# Patient Record
Sex: Male | Born: 1975 | Race: White | Hispanic: No | Marital: Married | Smoking: Light tobacco smoker
Health system: Southern US, Community
[De-identification: ages and names within clinical notes are randomized; demographics above are authoritative.]

## PROBLEM LIST (undated history)

## (undated) DIAGNOSIS — I10 Essential (primary) hypertension: Secondary | ICD-10-CM

## (undated) DIAGNOSIS — Z87442 Personal history of urinary calculi: Secondary | ICD-10-CM

## (undated) DIAGNOSIS — G4730 Sleep apnea, unspecified: Secondary | ICD-10-CM

## (undated) DIAGNOSIS — K759 Inflammatory liver disease, unspecified: Secondary | ICD-10-CM

## (undated) DIAGNOSIS — F32A Depression, unspecified: Secondary | ICD-10-CM

## (undated) HISTORY — PX: HERNIA REPAIR: SHX51

## (undated) HISTORY — PX: CARDIAC CATHETERIZATION: SHX172

---

## 2014-01-15 ENCOUNTER — Ambulatory Visit (HOSPITAL_COMMUNITY): Payer: Medicaid Other | Admitting: Physical Therapy

## 2014-01-17 ENCOUNTER — Ambulatory Visit (HOSPITAL_COMMUNITY)
Admission: RE | Admit: 2014-01-17 | Discharge: 2014-01-17 | Disposition: A | Discharge status: Home / Self Care | Payer: Medicaid Other | Source: Ambulatory Visit | Attending: Family Medicine | Admitting: Family Medicine

## 2014-01-17 DIAGNOSIS — M25651 Stiffness of right hip, not elsewhere classified: Secondary | ICD-10-CM | POA: Diagnosis not present

## 2014-01-17 DIAGNOSIS — M25551 Pain in right hip: Secondary | ICD-10-CM | POA: Insufficient documentation

## 2014-01-17 DIAGNOSIS — M1611 Unilateral primary osteoarthritis, right hip: Secondary | ICD-10-CM

## 2014-01-17 DIAGNOSIS — Z5189 Encounter for other specified aftercare: Secondary | ICD-10-CM | POA: Diagnosis present

## 2014-01-17 DIAGNOSIS — R29898 Other symptoms and signs involving the musculoskeletal system: Secondary | ICD-10-CM

## 2014-01-17 DIAGNOSIS — R262 Difficulty in walking, not elsewhere classified: Secondary | ICD-10-CM | POA: Insufficient documentation

## 2014-01-17 DIAGNOSIS — M25552 Pain in left hip: Secondary | ICD-10-CM | POA: Insufficient documentation

## 2014-01-17 DIAGNOSIS — M25652 Stiffness of left hip, not elsewhere classified: Secondary | ICD-10-CM | POA: Insufficient documentation

## 2014-01-17 DIAGNOSIS — M25659 Stiffness of unspecified hip, not elsewhere classified: Secondary | ICD-10-CM

## 2014-01-17 NOTE — Therapy (Signed)
[facility] [address] [place], [place], [address] Phone: [phone]   Fax:  [phone]  Physical Therapy Evaluation  Patient Details  Name: Jeremy RossettiDanny Dudley MRN: [id number] Date of Birth: 12/09/1975  Encounter Date: 01/17/2014      Dudley End of Session - 01/17/14 1508    Visit Number 1   Number of Visits 1   Authorization Type Medicaid [place]   Authorization - Visit Number 1   Authorization - Number of Visits 1   Dudley Start Time 0800   Dudley Stop Time 0845   Dudley Time Calculation (min) 45 min   Equipment Utilized During Treatment Gait belt   Activity Tolerance Patient tolerated treatment well   Behavior [facility] Therapy [facility] for tasks assessed/performed      No past medical history on file.  No past surgical history on file.  There were no vitals taken for this visit.  Visit Diagnosis:  Primary osteoarthritis of right hip  Weakness of both hips  Hip stiffness, unspecified laterality      Subjective Assessment - 01/17/14 0809    Pertinent History long history of hip pain over the last 20 years durign taikwondoe. Bilateral hip pain, Rt pain > Lt , no nyumbness and tinlging. history of back pain too, in sciatic region on Rt, no recent back pain. patient can be walking normal no pain and all of a sudden it comes on.    Diagnostic tests no recent xrays, previous xrays (7 years ago ) found osteo arthritis   Currently in Pain? Yes   Pain Score 0-No pain   Pain Location Hip   Pain Orientation Right;Left;Anterior   Pain Descriptors / Indicators Stabbing;Lambert ModySharp          [facility] Dudley Assessment - 01/17/14 0001    Assessment   Medical Diagnosis Bilateral hip pain.    Onset Date 01/17/94   Next MD Visit [place]   Precautions   Precautions None   Restrictions   Weight Bearing Restrictions No   Balance Screen   Has the patient fallen in the past 6 months No   Prior Function   Level of Independence Independent with basic ADLs   Sit to Stand    Comments independent no UE assist.    Other:   Other/ Comments Gait: excessive toe out, xcessive pronation, limited ability to toe in (none past neutral, excessicve trndelenberg gait   AROM   Overall AROM Comments Lt WNL unless noted   Right Hip Extension -10   Right Hip Flexion 55  painful 65 on Lt    Right Hip External Rotation  55   Right Hip Internal Rotation  -10   Right Hip ABduction 20   Left Hip Extension -10   Left Hip Flexion 65   Left Hip External Rotation  55   Left Hip Internal Rotation  -15   Left Hip ABduction 20   Right Knee Extension 0   Right Knee Flexion 130   Right Ankle Dorsiflexion 10  8on Lt    Strength   Right Hip Flexion 2+/5   Right Hip Extension 2-/5   Right Hip ABduction 3+/5   Right Knee Flexion 4/5   Right Knee Extension 5/5   Right Ankle Dorsiflexion 5/5            Dudley Education - 01/17/14 1507    Education provided Yes   Education Details HEP given, see patient instructions   Person(s) Educated Patient;Spouse   Methods Explanation;Demonstration;Handout;Tactile  cues;Verbal cues   Comprehension Verbalized understanding;Returned demonstration           Plan - 01/17/14 1508    Clinical Impression Statement Patient presents with severe hip pain secondary to severe hip stiffness resulting in difficulty walking and abnormal gait. patient would benefit from skilled phsyical therapy but is unable to financially afford continued care. Session focused of education of patient for performance of HEP and need to return to MD if pain does not improve as patient displays signs and symptoms consistent with severe osteoarthritis and possible proximal femoral fracture as indicated by positive gainslands test and  positive scours test.    Dudley Treatment/Interventions Therapeutic exercise   Dudley Next Visit Plan patient discharged with HEP and instruction to return to MD if pain continues.    Dudley Home Exercise Plan given   Consulted and Agree with Plan of  Care Patient        Jerilee FieldCash Joselin Crandell Dudley DPT [phone]     ____________________________________________________   ______________ Physician signature          Date

## 2014-01-17 NOTE — Patient Instructions (Signed)
Knee Roll   Lying on back, with knees bent and feet flat on floor, arms outstretched to sides, slowly roll both knees to side, hold 5 seconds. Back to starting position, hold 5 seconds. Then to opposite side, hold 5 seconds. Return to starting position. Keep shoulders and arms in contact with floor.   Copyright  VHI. All rights reserved.   Bridge   Lie back, legs bent. Inhale, pressing hips up. Keeping ribs in, lengthen lower back. Exhale, rolling down along spine from top. Repeat 10 times, hold 3 seconds. Do 2 sessions per day. Abduction   Lift leg up toward ceiling. Return.  Repeat 10 times build up to 20x each leg. Do 2 sessions per day.  http://[email or web address].exer.us/385   Copyright  VHI. All rights reserved.

## 2015-04-03 ENCOUNTER — Encounter (HOSPITAL_COMMUNITY): Payer: Self-pay

## 2018-10-01 ENCOUNTER — Ambulatory Visit: Payer: Self-pay | Admitting: Orthopaedic Surgery

## 2018-10-17 ENCOUNTER — Ambulatory Visit (INDEPENDENT_AMBULATORY_CARE_PROVIDER_SITE_OTHER): Payer: Medicare Other | Admitting: Orthopaedic Surgery

## 2018-10-17 ENCOUNTER — Ambulatory Visit (INDEPENDENT_AMBULATORY_CARE_PROVIDER_SITE_OTHER): Payer: Medicare Other

## 2018-10-17 VITALS — Ht 64.0 in | Wt 324.0 lb

## 2018-10-17 DIAGNOSIS — M25551 Pain in right hip: Secondary | ICD-10-CM | POA: Diagnosis not present

## 2018-10-17 DIAGNOSIS — M1612 Unilateral primary osteoarthritis, left hip: Secondary | ICD-10-CM

## 2018-10-17 DIAGNOSIS — M1611 Unilateral primary osteoarthritis, right hip: Secondary | ICD-10-CM | POA: Diagnosis not present

## 2018-10-17 MED ORDER — HYDROCODONE-ACETAMINOPHEN 5-325 MG PO TABS: 1.0000 | ORAL_TABLET | Freq: Four times a day (QID) | ORAL | 0 refills | Status: DC | PRN

## 2018-10-17 NOTE — Progress Notes (Signed)
Office Visit Note   Patient: Jeremy RossettiDanny Dudley           Date of Birth: 08/06/1975           MRN: [id number] Visit Date: 10/17/2018              Requested by: No referring provider defined for this encounter. PCP: Alvina FilbertHunter, Denise, MD   Assessment & Plan: Visit Diagnoses:  1. Pain in right hip   2. Unilateral primary osteoarthritis, left hip   3. Unilateral primary osteoarthritis, right hip     Plan: Based on his x-rays and clinical exam I do feel that the hip replacement for his right hip is needed.  I had a long and thorough discussion with him and he understands the difficulty of the surgery due to his morbid obesity and the complication risk being heightened for implant failure and dislocation as well as soft tissue issues.  I do feel he is heading in the right direction for weight standpoint and I believe that he will be a candidate for the surgery very soon.  I would like to see him back in 2 months.  This will be for repeat weight.  X-rays are not needed.  At that visit I can talk with him more about the surgery and showed him some implants and then make a decision of proceeding with a surgery date.  All question concerns were answered and addressed.  I will provide a short course of hydrocodone for him.  Follow-Up Instructions: Return in about 2 months (around 12/17/2018).   Orders:  Orders Placed This Encounter  Procedures  . XR HIP UNILAT W OR W/O PELVIS 1V RIGHT   Meds ordered this encounter  Medications  . HYDROcodone-acetaminophen (NORCO/VICODIN) 5-325 MG tablet    Sig: Take 1 tablet by mouth every 6 (six) hours as needed for moderate pain.    Dispense:  30 tablet    Refill:  0      Procedures: No procedures performed   Clinical Data: No additional findings.   Subjective: Chief Complaint  Patient presents with  . Right Hip - Pain  The patient is a very pleasant 43 year old gentleman with debilitating right hip pain for over 10 years now.  He is someone who is  morbidly obese with a BMI of over 50.  It is actually 55.6 today in the office.  He is 5 foot 4 and 324 pounds.  At his highest weight though he was 380 pounds.  He has been to other orthopedic practices before I have recommended weight loss appropriately in order to be able to successfully perform a total hip arthroplasty.  Both hips hurt quite significantly with the right one is much more severe than left hip.  He walks with canes.  He is worked on activity modification and weight loss.  He has lost even 10 pounds in the last 2 weeks alone.  He is highly motivated for continued to lose weight so he can proceed with hip replacement surgery.  His pain is 10 out of 10 and is daily.  It is detrimentally affect his mobility, his quality of life and his activities daily living.  At this point is a significant fall risk.  He is not a diabetic.  He denies any other significant medical issues.  I was actually able to review his chart and care everywhere to see what his weight was like in the past and what recommendations have been in the past.  He has  been referred to bariatric clinic before.  He is adverse to weight loss surgery given the fact that he has had family members that did not have success with the older surgery with significant complications and that scares him.  HPI  Review of Systems He currently denies any headache, chest pain, shortness of breath, fever, chills, nausea, vomiting  Objective: Vital Signs: Ht 5\' 4"  (1.626 m)   Wt (!) 324 lb (147 kg)   BMI 55.61 kg/m   Physical Exam He is alert and oriented x3 and in no acute distress.  He walks very slow and it is a struggle to get him up on any type of table to examine his hip due to his obesity. Ortho Exam I was able to lay him in a supine position.  His abdomen is easily able to mobilize to get out of the way for soft tissue planes to get appropriately to his right hip for a successful hip replacement in terms of a technically challenging  hip.  However his obesity does make this significant more challenging.  I can essentially not even rotate his right hip due to the severity of his pain and the likely deformity of that hip.  His left hip is painful but does move a little bit more smoothly. Specialty Comments:  No specialty comments available.  Imaging: Xr Hip Unilat W Or W/o Pelvis 1v Right  Result Date: 10/17/2018 An AP pelvis and lateral right hip shows complete loss of the hip space on the right side.  The hip femoral head on the right side is flattened significantly and has cystic changes as well.  There is likely a component of collapse.  The left hip has severe arthritic changes.    PMFS History: Patient Active Problem List   Diagnosis Date Noted  . Unilateral primary osteoarthritis, left hip 10/17/2018  . Unilateral primary osteoarthritis, right hip 10/17/2018   No past medical history on file.  No family history on file.   Social History   Occupational History  . Not on file  Tobacco Use  . Smoking status: Not on file  Substance and Sexual Activity  . Alcohol use: Not on file  . Drug use: Not on file  . Sexual activity: Not on file

## 2018-12-12 ENCOUNTER — Ambulatory Visit: Payer: Medicare Other | Admitting: Urology

## 2018-12-17 ENCOUNTER — Ambulatory Visit: Payer: Medicare Other | Admitting: Orthopaedic Surgery

## 2018-12-24 ENCOUNTER — Other Ambulatory Visit: Payer: Self-pay

## 2018-12-24 ENCOUNTER — Encounter: Payer: Self-pay | Admitting: Orthopaedic Surgery

## 2018-12-24 ENCOUNTER — Ambulatory Visit (INDEPENDENT_AMBULATORY_CARE_PROVIDER_SITE_OTHER): Payer: Medicare Other | Admitting: Orthopaedic Surgery

## 2018-12-24 VITALS — Wt 318.0 lb

## 2018-12-24 DIAGNOSIS — M1611 Unilateral primary osteoarthritis, right hip: Secondary | ICD-10-CM | POA: Diagnosis not present

## 2018-12-24 DIAGNOSIS — M1612 Unilateral primary osteoarthritis, left hip: Secondary | ICD-10-CM

## 2018-12-24 NOTE — Progress Notes (Signed)
The patient comes in today to be weighed again for consideration of total hip replacement surgery.  He does have severe end-stage arthritis of both sets of the right worse worse than left.  He walks with bilateral canes.  His pain is daily and is severe.  His x-rays of the last visit show severe end-stage arthritis of both hips with the right showing almost femoral head collapse.  At his last visit he weighed 324 pounds.  He is very short see as much higher BMI..  On exam today I had him lay supine.  I feel very comfortable getting the plane that we need to get to his right hip based on his anatomy.  His hip pain is severe with any attempts of rotation.  His weight today is 318 pounds which is down 6 pounds from his previous time being here.  However his BMI is 54.58.  He is very short and that contributes to her BMI.  Again, I am comfortable with proceeding with surgery once he has.  He would like to lose more weight and I think that will certainly help as well.  All question concerns were answered addressed.  We will see him back in 2 months for repeat weight.

## 2018-12-26 ENCOUNTER — Other Ambulatory Visit: Payer: Self-pay | Admitting: Orthopaedic Surgery

## 2018-12-26 ENCOUNTER — Telehealth: Payer: Self-pay | Admitting: Orthopaedic Surgery

## 2018-12-26 MED ORDER — HYDROCODONE-ACETAMINOPHEN 5-325 MG PO TABS: 1.0000 | ORAL_TABLET | Freq: Four times a day (QID) | ORAL | 0 refills | Status: DC | PRN

## 2018-12-26 NOTE — Telephone Encounter (Signed)
Pt called in requesting a refill on Hydrocodone, please have that sent to [facility].   [phone]

## 2018-12-26 NOTE — Telephone Encounter (Signed)
Please advise 

## 2019-01-09 ENCOUNTER — Ambulatory Visit (INDEPENDENT_AMBULATORY_CARE_PROVIDER_SITE_OTHER): Payer: Medicare Other | Admitting: Urology

## 2019-01-09 DIAGNOSIS — R3981 Functional urinary incontinence: Secondary | ICD-10-CM

## 2019-02-07 ENCOUNTER — Other Ambulatory Visit: Payer: Self-pay

## 2019-02-07 ENCOUNTER — Telehealth: Payer: Self-pay | Admitting: Urology

## 2019-02-07 DIAGNOSIS — N3941 Urge incontinence: Secondary | ICD-10-CM

## 2019-02-07 MED ORDER — MIRABEGRON ER 25 MG PO TB24: 25.0000 mg | ORAL_TABLET | Freq: Every day | ORAL | 0 refills | Status: DC

## 2019-02-07 NOTE — Telephone Encounter (Signed)
Pts daughter called and states that the samples pt got at last visit was working but they misplaced the rest and need a presciption. It was myrbetriq.  [facility] [place]

## 2019-02-07 NOTE — Telephone Encounter (Signed)
One time prescription refill sent in.

## 2019-02-07 NOTE — Progress Notes (Signed)
myrbetriq 25mg  prescription sent in as requested. No refills. Pt has appointment to see MD 1/20.

## 2019-02-20 ENCOUNTER — Ambulatory Visit (INDEPENDENT_AMBULATORY_CARE_PROVIDER_SITE_OTHER): Payer: Medicare Other | Admitting: Urology

## 2019-02-20 ENCOUNTER — Encounter: Payer: Self-pay | Admitting: Urology

## 2019-02-20 ENCOUNTER — Other Ambulatory Visit: Payer: Self-pay

## 2019-02-20 VITALS — BP 121/73 | HR 67 | Temp 95.8°F | Ht 64.0 in | Wt 310.0 lb

## 2019-02-20 DIAGNOSIS — N3941 Urge incontinence: Secondary | ICD-10-CM

## 2019-02-20 LAB — BLADDER SCAN AMB NON-IMAGING: Scan Result: 16.7

## 2019-02-20 MED ORDER — MIRABEGRON ER 50 MG PO TB24: 50.0000 mg | ORAL_TABLET | Freq: Every day | ORAL | 11 refills | Status: DC

## 2019-02-20 NOTE — Progress Notes (Signed)
Urological Symptom Review  Patient is experiencing the following symptoms:  Frequency Urge  Review of Systems  Gastrointestinal (upper)  : Negative for upper GI symptoms  Gastrointestinal (lower) : Negative for lower GI symptoms  Constitutional : Negative for symptoms  Skin: Negative for skin symptoms  Eyes: Negative for eye symptoms  Ear/Nose/Throat : Negative for Ear/Nose/Throat symptoms  Hematologic/Lymphatic: Negative for Hematologic/Lymphatic symptoms  Cardiovascular : Negative for cardiovascular symptoms  Respiratory : Negative for respiratory symptoms  Endocrine: Negative for endocrine symptoms  Musculoskeletal: Joint pain  Neurological: Negative for neurological symptoms  Psychologic: Negative for psychiatric symptoms negative

## 2019-02-20 NOTE — Progress Notes (Signed)
02/20/2019 11:16 AM   Jeremy Dudley [birth year] [id number]  Referring provider: Abran Richard, MD [address] [place],  [place] [address]  Urge incontinence  HPI: Jeremy Dudley is a 44yo here for followup for urgency and urge incontinence. No issues with constipation. Last visit he was started on mirabegron 25mg  and noted improvement in nocturia to 1x. Urge incontinence has resolved. Urgnecy and frequency improved. NO dysuria   PMH: No past medical history on file.  Surgical History: No past surgical history on file.  Home Medications:  Allergies as of 02/20/2019   No Known Allergies     Medication List       Accurate as of February 20, 2019 11:16 AM. If you have any questions, ask your nurse or doctor.        aspirin 81 MG chewable tablet Chew by mouth daily.   aspirin 81 MG EC tablet Take by mouth.   buPROPion 300 MG 24 hr tablet Commonly known as: WELLBUTRIN XL Take 300 mg by mouth daily.   citalopram 40 MG tablet Commonly known as: CELEXA Take 40 mg by mouth daily.   geriatric multivitamins-minerals Elix Take 15 mLs by mouth daily.   hydrochlorothiazide 12.5 MG capsule Commonly known as: MICROZIDE Take 12.5 mg by mouth daily.   HYDROcodone-acetaminophen 5-325 MG tablet Commonly known as: NORCO/VICODIN Take 1 tablet by mouth every 6 (six) hours as needed for moderate pain.   mirabegron ER 25 MG Tb24 tablet Commonly known as: MYRBETRIQ Take 1 tablet (25 mg total) by mouth daily.       Allergies: No Known Allergies  Family History: No family history on file.  Social History:  has no history on file for tobacco, alcohol, and drug.  ROS: All other review of systems was reviewed and are negative except what is noted above in HPI  Physical Exam: BP 121/73   Pulse 67   Temp (!) 95.8 F (35.4 C)   Ht 5\' 4"  (1.626 m)   Wt (!) 310 lb (140.6 kg)   BMI 53.21 kg/m   Constitutional:  Alert and oriented, No acute distress. HEENT: [place] AT, moist  mucus membranes.  Trachea midline, no masses. Cardiovascular: No clubbing, cyanosis, or edema. Respiratory: Normal respiratory effort, no increased work of breathing. GI: Abdomen is soft, nontender, nondistended, no abdominal masses GU: No CVA tenderness Lymph: No cervical or inguinal lymphadenopathy. Skin: No rashes, bruises or suspicious lesions. Neurologic: Grossly intact, no focal deficits, moving all 4 extremities. Psychiatric: Normal mood and affect.  Laboratory Data: No results found for: WBC, HGB, HCT, MCV, PLT  No results found for: CREATININE  No results found for: PSA  No results found for: TESTOSTERONE  No results found for: HGBA1C  Urinalysis No results found for: COLORURINE, APPEARANCEUR, LABSPEC, PHURINE, GLUCOSEU, HGBUR, BILIRUBINUR, KETONESUR, PROTEINUR, UROBILINOGEN, NITRITE, LEUKOCYTESUR  No results found for: LABMICR, [place], RBCUA, LABEPIT, MUCUS, BACTERIA  Pertinent Imaging:  No results found for this or any previous visit. No results found for this or any previous visit. No results found for this or any previous visit. No results found for this or any previous visit. No results found for this or any previous visit. No results found for this or any previous visit. No results found for this or any previous visit. No results found for this or any previous visit.  Assessment & Plan:    1. Urge incontinence -Increase mirabegron dose to 50mg   -RTC 6 months  - Bladder Scan (Post Void Residual) in  office   No follow-ups on file.  Jeremy Aye, MD  [facility] [place]

## 2019-02-20 NOTE — Patient Instructions (Signed)

## 2019-02-25 ENCOUNTER — Ambulatory Visit: Payer: Medicare Other | Admitting: Orthopaedic Surgery

## 2019-03-04 ENCOUNTER — Encounter: Payer: Self-pay | Admitting: Orthopaedic Surgery

## 2019-03-04 ENCOUNTER — Ambulatory Visit (INDEPENDENT_AMBULATORY_CARE_PROVIDER_SITE_OTHER): Payer: Medicare Other | Admitting: Orthopaedic Surgery

## 2019-03-04 ENCOUNTER — Other Ambulatory Visit: Payer: Self-pay

## 2019-03-04 ENCOUNTER — Other Ambulatory Visit: Payer: Self-pay | Admitting: Orthopaedic Surgery

## 2019-03-04 VITALS — Wt 317.0 lb

## 2019-03-04 DIAGNOSIS — M1612 Unilateral primary osteoarthritis, left hip: Secondary | ICD-10-CM | POA: Diagnosis not present

## 2019-03-04 DIAGNOSIS — M1611 Unilateral primary osteoarthritis, right hip: Secondary | ICD-10-CM

## 2019-03-04 MED ORDER — HYDROCODONE-ACETAMINOPHEN 5-325 MG PO TABS: 1.0000 | ORAL_TABLET | Freq: Four times a day (QID) | ORAL | 0 refills | Status: DC | PRN

## 2019-03-04 NOTE — Progress Notes (Signed)
The patient is well-known to me.  He is a morbidly obese 44 year old gentleman with debilitating arthritis of both his hips.  He has now down to 317 pounds.  He is still at a BMI of 54 but that is due to the fact that he is only 5 foot 4 inches tall.  On exam he has debilitating pain with any attempts of range of motion of either hip.  His hip x-rays show end-stage arthritis of both hips.  When I lay him in supine position I am becoming more comfortable with being able to get the soft tissue planes to perform hip replacement surgery on him.  We still need to him to lose a little more weight and he would like to as well.  Without being said we will see him back in 3 months.  We have encouraged still working on his diet and weight loss activities.  In 3 months from now we will see him back for repeat weight and BMI calculation..  All question concerns were answered and addressed.

## 2019-06-03 ENCOUNTER — Ambulatory Visit: Payer: Medicare Other | Admitting: Orthopaedic Surgery

## 2019-06-05 ENCOUNTER — Other Ambulatory Visit: Payer: Self-pay

## 2019-06-05 ENCOUNTER — Ambulatory Visit (INDEPENDENT_AMBULATORY_CARE_PROVIDER_SITE_OTHER): Payer: Medicare Other | Admitting: Orthopaedic Surgery

## 2019-06-05 VITALS — Ht 64.0 in | Wt 320.0 lb

## 2019-06-05 DIAGNOSIS — M1611 Unilateral primary osteoarthritis, right hip: Secondary | ICD-10-CM

## 2019-06-05 DIAGNOSIS — M1612 Unilateral primary osteoarthritis, left hip: Secondary | ICD-10-CM | POA: Diagnosis not present

## 2019-06-05 NOTE — Progress Notes (Signed)
The patient is well-known to me.  I have seen him for many months now.  He is someone with debilitating arthritis involving both of his hips.  He walks with walking sticks.  He is a short individual who is under 5 feet tall.  He does weigh 320 pounds with this puts his BMI at almost 55.  However he has lost 70 to 80 pounds as we have worked on getting his weight down.  Given his short stature makes his BMI certainly higher.  On exam today, I am able to easily lay him down in the supine position.  His ankles and feet are small so we can put the special boots on his feet for anterior hip surgery on the [place] table.  I am easily able to mobilize his abdomen to get to a soft tissue plane where I am comfortable performing hip replacement surgery on him.  His x-rays show complete loss of his joint space with both hips with the right much worse than the left.  He has tried and failed all forms conservative treatment.  I think he is at his maximum plateau of weight loss and hip replacement surgery at this point will give him the quality of life and ability he needs to continue his weight loss journey.  His pain is daily and it is 10 out of 10.  At this point it is so detrimentally affecting his mobility and his quality of life as well as activities day living he does wish to proceed with hip replacement surgery if I am comfortable with this as well.  I did let him know I am certainly comfortable but there are always heightened risks of acute blood loss anemia, nerve vessel injury, fracture, infection, DVT and implant failure as well as dislocation given his obesity.  He understands her goals are to decrease pain and improve mobility and overall poor quality of life.  We will work on getting him on the OR schedule for a right hip replacement in the near future.  All questions and concerns were answered and addressed.

## 2019-07-03 ENCOUNTER — Other Ambulatory Visit: Payer: Self-pay

## 2019-07-17 ENCOUNTER — Other Ambulatory Visit: Payer: Self-pay | Admitting: Physician Assistant

## 2019-07-18 NOTE — Progress Notes (Signed)
[facility] - [place], [place] - [address] [place] [address] Phone: [phone] Fax: [phone]      Your procedure is scheduled on July 23, 2019.  Report to [facility] Main Entrance "A" at 10:00 A.M., and check in at the Admitting office.  Call this number if you have problems the morning of surgery:  [phone]  Call [phone] if you have any questions prior to your surgery date Monday-Friday 8am-4pm    Remember:  Do not eat after midnight the night before your surgery  You may drink clear liquids until 9:00 the morning of your surgery.   Clear liquids allowed are: Water, Non-Citrus Juices (without pulp), Carbonated Beverages, Clear Tea, Black Coffee Only, and Gatorade  Please complete your PRE-SURGERY ENSURE that was provided to you by 9:00 the morning of surgery.  Please, if able, drink it in one setting. DO NOT SIP.    Take these medicines the morning of surgery with A SIP OF WATER: buPROPion (WELLBUTRIN XL) citalopram (CELEXA) metoprolol tartrate (LOPRESSOR) mirabegron ER (MYRBETRIQ)  As of today, STOP taking any Aspirin (unless otherwise instructed by your surgeon) and Aspirin containing products, Aleve, Naproxen, Ibuprofen, Motrin, Advil, Goody's, BC's, all herbal medications, fish oil, and all vitamins.                      Do not wear jewelry            Do not wear lotions, powders, colognes, or deodorant.            Do not shave 48 hours prior to surgery.              Do not bring valuables to the hospital.            [facility] is not responsible for any belongings or valuables.  Do NOT Smoke (Tobacco/Vapping) or drink Alcohol 24 hours prior to your procedure If you use a CPAP at night, you may bring all equipment for your overnight stay.   Contacts, glasses, dentures or bridgework may not be worn into surgery.      For patients admitted to the hospital, discharge time will be determined by your treatment  team.   Patients discharged the day of surgery will not be allowed to drive home, and someone needs to stay with them for 24 hours.    Special instructions:   [facility]- Preparing For Surgery  Before surgery, you can play an important role. Because skin is not sterile, your skin needs to be as free of germs as possible. You can reduce the number of germs on your skin by washing with CHG (chlorahexidine gluconate) Soap before surgery.  CHG is an antiseptic cleaner which kills germs and bonds with the skin to continue killing germs even after washing.    Oral Hygiene is also important to reduce your risk of infection.  Remember - BRUSH YOUR TEETH THE MORNING OF SURGERY WITH YOUR REGULAR TOOTHPASTE  Please do not use if you have an allergy to CHG or antibacterial soaps. If your skin becomes reddened/irritated stop using the CHG.  Do not shave (including legs and underarms) for at least 48 hours prior to first CHG shower. It is OK to shave your face.  Please follow these instructions carefully.   1. Shower the NIGHT BEFORE SURGERY and the MORNING OF SURGERY with CHG Soap.   2. If you chose to wash your hair, wash your hair first  as usual with your normal shampoo.  3. After you shampoo, rinse your hair and body thoroughly to remove the shampoo.  4. Use CHG as you would any other liquid soap. You can apply CHG directly to the skin and wash gently with a scrungie or a clean washcloth.   5. Apply the CHG Soap to your body ONLY FROM THE NECK DOWN.  Do not use on open wounds or open sores. Avoid contact with your eyes, ears, mouth and genitals (private parts). Wash Face and genitals (private parts)  with your normal soap.   6. Wash thoroughly, paying special attention to the area where your surgery will be performed.  7. Thoroughly rinse your body with warm water from the neck down.  8. DO NOT shower/wash with your normal soap after using and rinsing off the CHG Soap.  9. Pat yourself dry  with a CLEAN TOWEL.  10. Wear CLEAN PAJAMAS to bed the night before surgery, wear comfortable clothes the morning of surgery  11. Place CLEAN SHEETS on your bed the night of your first shower and DO NOT SLEEP WITH PETS.   Day of Surgery:   Do not apply any deodorants/lotions.  Please wear clean clothes to the hospital/surgery center.   Remember to brush your teeth WITH YOUR REGULAR TOOTHPASTE.   Please read over the following fact sheets that you were given.

## 2019-07-19 ENCOUNTER — Encounter (HOSPITAL_COMMUNITY)
Admission: RE | Admit: 2019-07-19 | Discharge: 2019-07-19 | Disposition: A | Discharge status: Home / Self Care | Payer: Medicare Other | Source: Ambulatory Visit | Attending: Orthopaedic Surgery | Admitting: Orthopaedic Surgery

## 2019-07-19 ENCOUNTER — Encounter (HOSPITAL_COMMUNITY): Payer: Self-pay

## 2019-07-19 ENCOUNTER — Other Ambulatory Visit (HOSPITAL_COMMUNITY)
Admission: RE | Admit: 2019-07-19 | Discharge: 2019-07-19 | Disposition: A | Discharge status: Home / Self Care | Payer: Medicare Other | Source: Ambulatory Visit | Attending: Orthopaedic Surgery | Admitting: Orthopaedic Surgery

## 2019-07-19 ENCOUNTER — Other Ambulatory Visit: Payer: Self-pay

## 2019-07-19 DIAGNOSIS — Z20822 Contact with and (suspected) exposure to covid-19: Secondary | ICD-10-CM | POA: Insufficient documentation

## 2019-07-19 DIAGNOSIS — Z01818 Encounter for other preprocedural examination: Secondary | ICD-10-CM | POA: Diagnosis not present

## 2019-07-19 HISTORY — DX: Essential (primary) hypertension: I10

## 2019-07-19 HISTORY — DX: Inflammatory liver disease, unspecified: K75.9

## 2019-07-19 HISTORY — DX: Sleep apnea, unspecified: G47.30

## 2019-07-19 HISTORY — DX: Personal history of urinary calculi: Z87.442

## 2019-07-19 HISTORY — DX: Depression, unspecified: F32.A

## 2019-07-19 LAB — COMPREHENSIVE METABOLIC PANEL
ALT: 26 U/L (ref 0–44)
AST: 21 U/L (ref 15–41)
Albumin: 4 g/dL (ref 3.5–5.0)
Alkaline Phosphatase: 73 U/L (ref 38–126)
Anion gap: 12 (ref 5–15)
BUN: 9 mg/dL (ref 6–20)
CO2: 27 mmol/L (ref 22–32)
Calcium: 9.1 mg/dL (ref 8.9–10.3)
Chloride: 100 mmol/L (ref 98–111)
Creatinine, Ser: 0.95 mg/dL (ref 0.61–1.24)
GFR calc Af Amer: >60 mL/min (ref >60)
GFR calc non Af Amer: >60 mL/min (ref >60)
Glucose, Bld: 131 mg/dL — ABNORMAL HIGH (ref 70–99)
Potassium: 3.8 mmol/L (ref 3.5–5.1)
Sodium: 139 mmol/L (ref 135–145)
Total Bilirubin: 0.4 mg/dL (ref 0.3–1.2)
Total Protein: 7.1 g/dL (ref 6.5–8.1)

## 2019-07-19 LAB — SURGICAL PCR SCREEN
MRSA, PCR: NEGATIVE
Staphylococcus aureus: POSITIVE — AB

## 2019-07-19 LAB — CBC
HCT: 42 % (ref 39.0–52.0)
Hemoglobin: 13.7 g/dL (ref 13.0–17.0)
MCH: 29.8 pg (ref 26.0–34.0)
MCHC: 32.6 g/dL (ref 30.0–36.0)
MCV: 91.3 fL (ref 80.0–100.0)
Platelets: 294 10*3/uL (ref 150–400)
RBC: 4.6 MIL/uL (ref 4.22–5.81)
RDW: 12.5 % (ref 11.5–15.5)
WBC: 11.7 10*3/uL — ABNORMAL HIGH (ref 4.0–10.5)
nRBC: 0 % (ref 0.0–0.2)

## 2019-07-19 LAB — TYPE AND SCREEN
ABO/RH(D): A POS
Antibody Screen: NEGATIVE

## 2019-07-19 LAB — ABO/RH: ABO/RH(D): A POS

## 2019-07-19 NOTE — Progress Notes (Signed)
PCP:  Alvina Filbert, MD Cardiologist: Denies  EKG:  07/19/19 CXR:  N/A ECHO:  Denies Stress Test:  [facility] Cardiac Cath: >10 years ago  Covid testing 07/19/19  Patient denies shortness of breath, fever, cough, and chest pain at PAT appointment.  Patient verbalized understanding of instructions provided today at the PAT appointment.  Patient asked to review instructions at home and day of surgery.

## 2019-07-20 LAB — SARS CORONAVIRUS 2 (TAT 6-24 HRS): SARS Coronavirus 2: NEGATIVE

## 2019-07-22 MED ORDER — DEXTROSE 5 % IV SOLN
3.0000 g | INTRAVENOUS | Status: AC
  Administered 2019-07-23: 3 g via INTRAVENOUS
  Filled 2019-07-22: qty 3

## 2019-07-23 ENCOUNTER — Ambulatory Visit (HOSPITAL_COMMUNITY): Payer: Medicare Other

## 2019-07-23 ENCOUNTER — Observation Stay (HOSPITAL_COMMUNITY): Payer: Medicare Other

## 2019-07-23 ENCOUNTER — Encounter (HOSPITAL_COMMUNITY)
Admission: RE | Disposition: A | Discharge status: Home Health | Payer: Self-pay | Source: Home / Self Care | Attending: Orthopaedic Surgery

## 2019-07-23 ENCOUNTER — Other Ambulatory Visit: Payer: Self-pay

## 2019-07-23 ENCOUNTER — Encounter (HOSPITAL_COMMUNITY): Payer: Self-pay | Admitting: Orthopaedic Surgery

## 2019-07-23 ENCOUNTER — Inpatient Hospital Stay (HOSPITAL_COMMUNITY)
Admission: RE | Admit: 2019-07-23 | Discharge: 2019-07-25 | DRG: 470 | Disposition: A | Discharge status: Home Health | Payer: Medicare Other | Attending: Orthopaedic Surgery | Admitting: Orthopaedic Surgery

## 2019-07-23 DIAGNOSIS — Z419 Encounter for procedure for purposes other than remedying health state, unspecified: Secondary | ICD-10-CM

## 2019-07-23 DIAGNOSIS — Z96641 Presence of right artificial hip joint: Secondary | ICD-10-CM

## 2019-07-23 DIAGNOSIS — Z79899 Other long term (current) drug therapy: Secondary | ICD-10-CM

## 2019-07-23 DIAGNOSIS — F329 Major depressive disorder, single episode, unspecified: Secondary | ICD-10-CM | POA: Diagnosis present

## 2019-07-23 DIAGNOSIS — M1611 Unilateral primary osteoarthritis, right hip: Principal | ICD-10-CM

## 2019-07-23 DIAGNOSIS — G4730 Sleep apnea, unspecified: Secondary | ICD-10-CM | POA: Diagnosis present

## 2019-07-23 DIAGNOSIS — I10 Essential (primary) hypertension: Secondary | ICD-10-CM | POA: Diagnosis present

## 2019-07-23 DIAGNOSIS — K759 Inflammatory liver disease, unspecified: Secondary | ICD-10-CM | POA: Diagnosis present

## 2019-07-23 DIAGNOSIS — K760 Fatty (change of) liver, not elsewhere classified: Secondary | ICD-10-CM | POA: Diagnosis present

## 2019-07-23 DIAGNOSIS — Z6841 Body Mass Index (BMI) 40.0 and over, adult: Secondary | ICD-10-CM

## 2019-07-23 DIAGNOSIS — F17290 Nicotine dependence, other tobacco product, uncomplicated: Secondary | ICD-10-CM | POA: Diagnosis present

## 2019-07-23 DIAGNOSIS — Z7982 Long term (current) use of aspirin: Secondary | ICD-10-CM

## 2019-07-23 HISTORY — PX: TOTAL HIP ARTHROPLASTY: SHX124

## 2019-07-23 SURGERY — ARTHROPLASTY, HIP, TOTAL, ANTERIOR APPROACH
Anesthesia: Choice | Site: Hip | Laterality: Right

## 2019-07-23 MED ORDER — FENTANYL CITRATE (PF) 100 MCG/2ML IJ SOLN
INTRAMUSCULAR | Status: AC
  Filled 2019-07-23: qty 2

## 2019-07-23 MED ORDER — METOCLOPRAMIDE HCL 5 MG PO TABS: 5.0000 mg | ORAL_TABLET | Freq: Three times a day (TID) | ORAL | Status: DC | PRN

## 2019-07-23 MED ORDER — OXYCODONE HCL 5 MG PO TABS
ORAL_TABLET | ORAL | Status: AC
  Filled 2019-07-23: qty 1

## 2019-07-23 MED ORDER — ORAL CARE MOUTH RINSE: 15.0000 mL | Freq: Once | OROMUCOSAL | Status: AC

## 2019-07-23 MED ORDER — METOPROLOL TARTRATE 25 MG PO TABS
25.0000 mg | ORAL_TABLET | Freq: Two times a day (BID) | ORAL | Status: DC
  Administered 2019-07-23 – 2019-07-25 (×4): 25 mg via ORAL
  Filled 2019-07-23 (×4): qty 1

## 2019-07-23 MED ORDER — EPHEDRINE 5 MG/ML INJ
INTRAVENOUS | Status: AC
  Filled 2019-07-23: qty 10

## 2019-07-23 MED ORDER — SUCCINYLCHOLINE CHLORIDE 20 MG/ML IJ SOLN
INTRAMUSCULAR | Status: DC | PRN
  Administered 2019-07-23: 180 mg via INTRAVENOUS

## 2019-07-23 MED ORDER — OXYCODONE HCL 5 MG PO TABS
10.0000 mg | ORAL_TABLET | ORAL | Status: DC | PRN
  Administered 2019-07-24 – 2019-07-25 (×6): 15 mg via ORAL
  Filled 2019-07-23 (×6): qty 3

## 2019-07-23 MED ORDER — POLYETHYLENE GLYCOL 3350 17 G PO PACK: 17.0000 g | PACK | Freq: Every day | ORAL | Status: DC | PRN

## 2019-07-23 MED ORDER — SUCCINYLCHOLINE CHLORIDE 200 MG/10ML IV SOSY
PREFILLED_SYRINGE | INTRAVENOUS | Status: AC
  Filled 2019-07-23: qty 10

## 2019-07-23 MED ORDER — METOCLOPRAMIDE HCL 5 MG/ML IJ SOLN: 5.0000 mg | Freq: Three times a day (TID) | INTRAMUSCULAR | Status: DC | PRN

## 2019-07-23 MED ORDER — CEFAZOLIN SODIUM-DEXTROSE 2-4 GM/100ML-% IV SOLN
2.0000 g | Freq: Four times a day (QID) | INTRAVENOUS | Status: AC
  Administered 2019-07-23 (×2): 2 g via INTRAVENOUS
  Filled 2019-07-23 (×2): qty 100

## 2019-07-23 MED ORDER — METHOCARBAMOL 1000 MG/10ML IJ SOLN
500.0000 mg | Freq: Four times a day (QID) | INTRAVENOUS | Status: DC | PRN
  Filled 2019-07-23: qty 5

## 2019-07-23 MED ORDER — ALBUMIN HUMAN 5 % IV SOLN
INTRAVENOUS | Status: DC | PRN
Start: 2019-07-23 — End: 2019-07-23

## 2019-07-23 MED ORDER — ROCURONIUM BROMIDE 10 MG/ML (PF) SYRINGE
PREFILLED_SYRINGE | INTRAVENOUS | Status: DC | PRN
  Administered 2019-07-23: 20 mg via INTRAVENOUS
  Administered 2019-07-23 (×2): 25 mg via INTRAVENOUS

## 2019-07-23 MED ORDER — ADULT MULTIVITAMIN W/MINERALS CH
1.0000 | ORAL_TABLET | Freq: Every day | ORAL | Status: DC
  Administered 2019-07-24 – 2019-07-25 (×2): 1 via ORAL
  Filled 2019-07-23 (×2): qty 1

## 2019-07-23 MED ORDER — PROPOFOL 10 MG/ML IV BOLUS
INTRAVENOUS | Status: AC
  Filled 2019-07-23: qty 40

## 2019-07-23 MED ORDER — ONDANSETRON HCL 4 MG/2ML IJ SOLN
INTRAMUSCULAR | Status: DC | PRN
  Administered 2019-07-23: 4 mg via INTRAVENOUS

## 2019-07-23 MED ORDER — BUPROPION HCL ER (XL) 150 MG PO TB24
300.0000 mg | ORAL_TABLET | Freq: Every day | ORAL | Status: DC
  Administered 2019-07-24 – 2019-07-25 (×2): 300 mg via ORAL
  Filled 2019-07-23 (×2): qty 2

## 2019-07-23 MED ORDER — DEXMEDETOMIDINE HCL IN NACL 200 MCG/50ML IV SOLN
INTRAVENOUS | Status: DC | PRN
  Administered 2019-07-23: 8 ug via INTRAVENOUS

## 2019-07-23 MED ORDER — ACETAMINOPHEN 160 MG/5ML PO SOLN: 1000.0000 mg | Freq: Once | ORAL | Status: DC | PRN

## 2019-07-23 MED ORDER — FENTANYL CITRATE (PF) 100 MCG/2ML IJ SOLN
25.0000 ug | INTRAMUSCULAR | Status: DC | PRN
  Administered 2019-07-23 (×3): 50 ug via INTRAVENOUS

## 2019-07-23 MED ORDER — LACTATED RINGERS IV SOLN: INTRAVENOUS | Status: DC

## 2019-07-23 MED ORDER — LIDOCAINE 2% (20 MG/ML) 5 ML SYRINGE
INTRAMUSCULAR | Status: AC
  Filled 2019-07-23: qty 5

## 2019-07-23 MED ORDER — COQ10 200 MG PO CAPS: 200.0000 mg | ORAL_CAPSULE | Freq: Every day | ORAL | Status: DC

## 2019-07-23 MED ORDER — SIMVASTATIN 20 MG PO TABS
40.0000 mg | ORAL_TABLET | Freq: Every day | ORAL | Status: DC
  Administered 2019-07-23 – 2019-07-24 (×2): 40 mg via ORAL
  Filled 2019-07-23 (×2): qty 2

## 2019-07-23 MED ORDER — FENTANYL CITRATE (PF) 100 MCG/2ML IJ SOLN
INTRAMUSCULAR | Status: DC | PRN
  Administered 2019-07-23: 50 ug via INTRAVENOUS
  Administered 2019-07-23: 100 ug via INTRAVENOUS
  Administered 2019-07-23 (×2): 50 ug via INTRAVENOUS

## 2019-07-23 MED ORDER — TRANEXAMIC ACID-NACL 1000-0.7 MG/100ML-% IV SOLN
1000.0000 mg | INTRAVENOUS | Status: AC
  Administered 2019-07-23: 1000 mg via INTRAVENOUS

## 2019-07-23 MED ORDER — FENTANYL CITRATE (PF) 250 MCG/5ML IJ SOLN
INTRAMUSCULAR | Status: AC
  Filled 2019-07-23: qty 5

## 2019-07-23 MED ORDER — ROCURONIUM BROMIDE 10 MG/ML (PF) SYRINGE
PREFILLED_SYRINGE | INTRAVENOUS | Status: AC
  Filled 2019-07-23: qty 10

## 2019-07-23 MED ORDER — OXYCODONE HCL 5 MG/5ML PO SOLN: 5.0000 mg | Freq: Once | ORAL | Status: AC | PRN

## 2019-07-23 MED ORDER — ACETAMINOPHEN 10 MG/ML IV SOLN
1000.0000 mg | Freq: Once | INTRAVENOUS | Status: DC | PRN
  Administered 2019-07-23: 1000 mg via INTRAVENOUS

## 2019-07-23 MED ORDER — HYDROMORPHONE HCL 1 MG/ML IJ SOLN
0.5000 mg | INTRAMUSCULAR | Status: DC | PRN
  Administered 2019-07-23: 0.5 mg via INTRAVENOUS
  Administered 2019-07-23 – 2019-07-24 (×2): 1 mg via INTRAVENOUS
  Filled 2019-07-23 (×3): qty 1

## 2019-07-23 MED ORDER — ASPIRIN 81 MG PO CHEW
81.0000 mg | CHEWABLE_TABLET | Freq: Two times a day (BID) | ORAL | Status: DC
  Administered 2019-07-23 – 2019-07-25 (×4): 81 mg via ORAL
  Filled 2019-07-23 (×4): qty 1

## 2019-07-23 MED ORDER — KETAMINE HCL 50 MG/5ML IJ SOSY
PREFILLED_SYRINGE | INTRAMUSCULAR | Status: AC
  Filled 2019-07-23: qty 10

## 2019-07-23 MED ORDER — KETAMINE HCL 10 MG/ML IJ SOLN
INTRAMUSCULAR | Status: DC | PRN
  Administered 2019-07-23: 50 mg via INTRAVENOUS

## 2019-07-23 MED ORDER — LIDOCAINE 2% (20 MG/ML) 5 ML SYRINGE
INTRAMUSCULAR | Status: DC | PRN
  Administered 2019-07-23: 80 mg via INTRAVENOUS

## 2019-07-23 MED ORDER — CHLORHEXIDINE GLUCONATE 0.12 % MT SOLN: 15.0000 mL | Freq: Once | OROMUCOSAL | Status: AC

## 2019-07-23 MED ORDER — TRANEXAMIC ACID-NACL 1000-0.7 MG/100ML-% IV SOLN
INTRAVENOUS | Status: AC
  Filled 2019-07-23: qty 100

## 2019-07-23 MED ORDER — GABAPENTIN 100 MG PO CAPS
100.0000 mg | ORAL_CAPSULE | Freq: Three times a day (TID) | ORAL | Status: DC
  Administered 2019-07-23 – 2019-07-25 (×6): 100 mg via ORAL
  Filled 2019-07-23 (×7): qty 1

## 2019-07-23 MED ORDER — DEXAMETHASONE SODIUM PHOSPHATE 10 MG/ML IJ SOLN
INTRAMUSCULAR | Status: DC | PRN
  Administered 2019-07-23: 5 mg via INTRAVENOUS

## 2019-07-23 MED ORDER — PHENYLEPHRINE 40 MCG/ML (10ML) SYRINGE FOR IV PUSH (FOR BLOOD PRESSURE SUPPORT)
PREFILLED_SYRINGE | INTRAVENOUS | Status: AC
  Filled 2019-07-23: qty 10

## 2019-07-23 MED ORDER — MIRABEGRON ER 50 MG PO TB24
50.0000 mg | ORAL_TABLET | Freq: Every day | ORAL | Status: DC
  Administered 2019-07-24 – 2019-07-25 (×2): 50 mg via ORAL
  Filled 2019-07-23 (×3): qty 1

## 2019-07-23 MED ORDER — MIDAZOLAM HCL 2 MG/2ML IJ SOLN
INTRAMUSCULAR | Status: AC
  Filled 2019-07-23: qty 2

## 2019-07-23 MED ORDER — OXYCODONE HCL 5 MG PO TABS: 5.0000 mg | ORAL_TABLET | ORAL | Status: DC | PRN

## 2019-07-23 MED ORDER — SODIUM CHLORIDE 0.9 % IV SOLN: INTRAVENOUS | Status: DC

## 2019-07-23 MED ORDER — CITALOPRAM HYDROBROMIDE 20 MG PO TABS
40.0000 mg | ORAL_TABLET | Freq: Every day | ORAL | Status: DC
  Administered 2019-07-24 – 2019-07-25 (×2): 40 mg via ORAL
  Filled 2019-07-23 (×2): qty 2

## 2019-07-23 MED ORDER — PROPOFOL 10 MG/ML IV BOLUS
INTRAVENOUS | Status: DC | PRN
  Administered 2019-07-23: 140 mg via INTRAVENOUS
  Administered 2019-07-23: 60 mg via INTRAVENOUS

## 2019-07-23 MED ORDER — ADULT MULTIVITAMIN W/MINERALS CH
1.0000 | ORAL_TABLET | Freq: Every day | ORAL | Status: DC
  Filled 2019-07-23: qty 1

## 2019-07-23 MED ORDER — 0.9 % SODIUM CHLORIDE (POUR BTL) OPTIME
TOPICAL | Status: DC | PRN
  Administered 2019-07-23: 1000 mL

## 2019-07-23 MED ORDER — DIPHENHYDRAMINE HCL 12.5 MG/5ML PO ELIX: 12.5000 mg | ORAL_SOLUTION | ORAL | Status: DC | PRN

## 2019-07-23 MED ORDER — OXYCODONE HCL 5 MG PO TABS
5.0000 mg | ORAL_TABLET | Freq: Once | ORAL | Status: AC | PRN
  Administered 2019-07-23: 5 mg via ORAL

## 2019-07-23 MED ORDER — METHOCARBAMOL 500 MG PO TABS
ORAL_TABLET | ORAL | Status: AC
  Filled 2019-07-23: qty 1

## 2019-07-23 MED ORDER — PANTOPRAZOLE SODIUM 40 MG PO TBEC
40.0000 mg | DELAYED_RELEASE_TABLET | Freq: Every day | ORAL | Status: DC
  Administered 2019-07-24 – 2019-07-25 (×2): 40 mg via ORAL
  Filled 2019-07-23 (×2): qty 1

## 2019-07-23 MED ORDER — ACETAMINOPHEN 10 MG/ML IV SOLN
INTRAVENOUS | Status: AC
  Filled 2019-07-23: qty 100

## 2019-07-23 MED ORDER — PHENOL 1.4 % MT LIQD: 1.0000 | OROMUCOSAL | Status: DC | PRN

## 2019-07-23 MED ORDER — ACETAMINOPHEN 325 MG PO TABS: 325.0000 mg | ORAL_TABLET | Freq: Four times a day (QID) | ORAL | Status: DC | PRN

## 2019-07-23 MED ORDER — POVIDONE-IODINE 10 % EX SWAB
2.0000 "application " | Freq: Once | CUTANEOUS | Status: AC
  Administered 2019-07-23: 2 via TOPICAL

## 2019-07-23 MED ORDER — SUGAMMADEX SODIUM 200 MG/2ML IV SOLN
INTRAVENOUS | Status: DC | PRN
  Administered 2019-07-23: 300 mg via INTRAVENOUS

## 2019-07-23 MED ORDER — OXYCODONE HCL 5 MG PO TABS
ORAL_TABLET | ORAL | Status: AC
  Administered 2019-07-23: 5 mg
  Filled 2019-07-23: qty 2

## 2019-07-23 MED ORDER — ACETAMINOPHEN 500 MG PO TABS: 1000.0000 mg | ORAL_TABLET | Freq: Once | ORAL | Status: DC | PRN

## 2019-07-23 MED ORDER — SODIUM CHLORIDE 0.9 % IR SOLN
Status: DC | PRN
  Administered 2019-07-23: 3000 mL

## 2019-07-23 MED ORDER — ALUM & MAG HYDROXIDE-SIMETH 200-200-20 MG/5ML PO SUSP: 30.0000 mL | ORAL | Status: DC | PRN

## 2019-07-23 MED ORDER — ONDANSETRON HCL 4 MG/2ML IJ SOLN
INTRAMUSCULAR | Status: AC
  Filled 2019-07-23: qty 2

## 2019-07-23 MED ORDER — ZOLPIDEM TARTRATE 5 MG PO TABS: 5.0000 mg | ORAL_TABLET | Freq: Every evening | ORAL | Status: DC | PRN

## 2019-07-23 MED ORDER — CHLORHEXIDINE GLUCONATE 0.12 % MT SOLN
OROMUCOSAL | Status: AC
  Administered 2019-07-23: 15 mL via OROMUCOSAL
  Filled 2019-07-23: qty 15

## 2019-07-23 MED ORDER — ONDANSETRON HCL 4 MG PO TABS: 4.0000 mg | ORAL_TABLET | Freq: Four times a day (QID) | ORAL | Status: DC | PRN

## 2019-07-23 MED ORDER — ONDANSETRON HCL 4 MG/2ML IJ SOLN: 4.0000 mg | Freq: Four times a day (QID) | INTRAMUSCULAR | Status: DC | PRN

## 2019-07-23 MED ORDER — MENTHOL 3 MG MT LOZG: 1.0000 | LOZENGE | OROMUCOSAL | Status: DC | PRN

## 2019-07-23 MED ORDER — DOCUSATE SODIUM 100 MG PO CAPS
100.0000 mg | ORAL_CAPSULE | Freq: Two times a day (BID) | ORAL | Status: DC
  Administered 2019-07-23 – 2019-07-25 (×4): 100 mg via ORAL
  Filled 2019-07-23 (×4): qty 1

## 2019-07-23 MED ORDER — MIDAZOLAM HCL 2 MG/2ML IJ SOLN
INTRAMUSCULAR | Status: DC | PRN
  Administered 2019-07-23: 2 mg via INTRAVENOUS

## 2019-07-23 MED ORDER — METHOCARBAMOL 500 MG PO TABS
500.0000 mg | ORAL_TABLET | Freq: Four times a day (QID) | ORAL | Status: DC | PRN
  Administered 2019-07-23 – 2019-07-25 (×4): 500 mg via ORAL
  Filled 2019-07-23 (×3): qty 1

## 2019-07-23 SURGICAL SUPPLY — 53 items
BENZOIN TINCTURE PRP APPL 2/3 (GAUZE/BANDAGES/DRESSINGS) ×3 IMPLANT
BLADE SAW SGTL 18X1.27X75 (BLADE) ×2 IMPLANT
BLADE SAW SGTL 18X1.27X75MM (BLADE) ×1
CLOSURE WOUND 1/2 X4 (GAUZE/BANDAGES/DRESSINGS) ×2
COVER SURGICAL LIGHT HANDLE (MISCELLANEOUS) ×3 IMPLANT
DRAPE C-ARM 42X72 X-RAY (DRAPES) ×3 IMPLANT
DRAPE STERI IOBAN 125X83 (DRAPES) ×3 IMPLANT
DRAPE U-SHAPE 47X51 STRL (DRAPES) ×9 IMPLANT
DRSG AQUACEL AG ADV 3.5X10 (GAUZE/BANDAGES/DRESSINGS) ×3 IMPLANT
DURAPREP 26ML APPLICATOR (WOUND CARE) ×3 IMPLANT
ELECT BLADE 4.0 EZ CLEAN MEGAD (MISCELLANEOUS) ×3
ELECT BLADE 6.5 EXT (BLADE) IMPLANT
ELECT REM PT RETURN 9FT ADLT (ELECTROSURGICAL) ×3
ELECTRODE BLDE 4.0 EZ CLN MEGD (MISCELLANEOUS) ×1 IMPLANT
ELECTRODE REM PT RTRN 9FT ADLT (ELECTROSURGICAL) ×1 IMPLANT
FACESHIELD WRAPAROUND (MASK) ×6 IMPLANT
GLOVE BIO SURGEON STRL SZ 6.5 (GLOVE) ×6 IMPLANT
GLOVE BIO SURGEONS STRL SZ 6.5 (GLOVE) ×3
GLOVE BIOGEL PI IND STRL 6.5 (GLOVE) ×1 IMPLANT
GLOVE BIOGEL PI IND STRL 8 (GLOVE) ×2 IMPLANT
GLOVE BIOGEL PI INDICATOR 6.5 (GLOVE) ×2
GLOVE BIOGEL PI INDICATOR 8 (GLOVE) ×4
GLOVE ECLIPSE 8.0 STRL XLNG CF (GLOVE) ×3 IMPLANT
GLOVE ORTHO TXT STRL SZ7.5 (GLOVE) ×6 IMPLANT
GOWN STRL REUS W/ TWL LRG LVL3 (GOWN DISPOSABLE) ×2 IMPLANT
GOWN STRL REUS W/ TWL XL LVL3 (GOWN DISPOSABLE) ×2 IMPLANT
GOWN STRL REUS W/TWL LRG LVL3 (GOWN DISPOSABLE) ×4
GOWN STRL REUS W/TWL XL LVL3 (GOWN DISPOSABLE) ×4
HANDPIECE INTERPULSE COAX TIP (DISPOSABLE) ×2
HEAD CERAMIC DELTA 36 PLUS 1.5 (Hips) ×3 IMPLANT
KIT BASIN OR (CUSTOM PROCEDURE TRAY) ×3 IMPLANT
KIT TURNOVER KIT B (KITS) ×3 IMPLANT
LINER NEUTRAL 52X36MM PLUS 4 (Liner) ×3 IMPLANT
MANIFOLD NEPTUNE II (INSTRUMENTS) ×3 IMPLANT
NS IRRIG 1000ML POUR BTL (IV SOLUTION) ×3 IMPLANT
PACK TOTAL JOINT (CUSTOM PROCEDURE TRAY) ×3 IMPLANT
PAD ARMBOARD 7.5X6 YLW CONV (MISCELLANEOUS) ×3 IMPLANT
PIN SECTOR W/GRIP ACE CUP 52MM (Hips) ×3 IMPLANT
SET HNDPC FAN SPRY TIP SCT (DISPOSABLE) ×1 IMPLANT
STAPLER VISISTAT 35W (STAPLE) ×3 IMPLANT
STEM CORAIL KA11 (Stem) ×3 IMPLANT
STRIP CLOSURE SKIN 1/2X4 (GAUZE/BANDAGES/DRESSINGS) ×4 IMPLANT
SUT ETHIBOND NAB CT1 #1 30IN (SUTURE) ×3 IMPLANT
SUT MNCRL AB 4-0 PS2 18 (SUTURE) IMPLANT
SUT VIC AB 0 CT1 27 (SUTURE) ×2
SUT VIC AB 0 CT1 27XBRD ANBCTR (SUTURE) ×1 IMPLANT
SUT VIC AB 1 CT1 27 (SUTURE) ×2
SUT VIC AB 1 CT1 27XBRD ANBCTR (SUTURE) ×1 IMPLANT
SUT VIC AB 2-0 CT1 27 (SUTURE) ×2
SUT VIC AB 2-0 CT1 TAPERPNT 27 (SUTURE) ×1 IMPLANT
TOWEL GREEN STERILE (TOWEL DISPOSABLE) ×3 IMPLANT
TOWEL GREEN STERILE FF (TOWEL DISPOSABLE) ×3 IMPLANT
WATER STERILE IRR 1000ML POUR (IV SOLUTION) ×6 IMPLANT

## 2019-07-23 NOTE — Brief Op Note (Signed)
07/23/2019  1:57 PM  PATIENT:  Feliciana Rossetti  44 y.o. male  PRE-OPERATIVE DIAGNOSIS:  osteoarthritis right hip  POST-OPERATIVE DIAGNOSIS:  osteoarthritis right hip  PROCEDURE:  Procedure(s): RIGHT TOTAL HIP ARTHROPLASTY ANTERIOR APPROACH (Right)  SURGEON:  Surgeon(s) and Role:    Kathryne Hitch, MD - Primary  PHYSICIAN ASSISTANT:  Rexene Edison, PA-C  ANESTHESIA:   general  EBL:  350 mL   COUNTS:  YES  DICTATION: .Other Dictation: Dictation Number [id number]  PLAN OF CARE: Admit to inpatient   PATIENT DISPOSITION:  PACU - hemodynamically stable.   Delay start of Pharmacological VTE agent (>24hrs) due to surgical blood loss or risk of bleeding: no

## 2019-07-23 NOTE — Transfer of Care (Signed)
Immediate Anesthesia Transfer of Care Note  Patient: Jeremy Dudley  Procedure(s) Performed: RIGHT TOTAL HIP ARTHROPLASTY ANTERIOR APPROACH (Right Hip)  Patient Location: PACU  Anesthesia Type:General  Level of Consciousness: awake and alert   Airway & Oxygen Therapy: Patient Spontanous Breathing  Post-op Assessment: Report given to RN and Post -op Vital signs reviewed and stable  Post vital signs: Reviewed and stable  Last Vitals:  Vitals Value Taken Time  BP 121/79 07/23/19 1427  Temp    Pulse 76 07/23/19 1432  Resp 27 07/23/19 1432  SpO2 94 % 07/23/19 1432  Vitals shown include unvalidated device data.  Last Pain:  Vitals:   07/23/19 1039  TempSrc:   PainSc: 8       Patients Stated Pain Goal: 0 (07/23/19 1039)  Complications:  Encounter Complications  Complication Outcome Phase Comment  Agitation requiring restraint or medication  Intraprocedure pt woke up abruptly, agitated and disoriented. Precedex given, pt reoriented and calm

## 2019-07-23 NOTE — Anesthesia Procedure Notes (Signed)
Procedure Name: Intubation Date/Time: 07/23/2019 12:29 PM Performed by: Barrington Ellison, CRNA Pre-anesthesia Checklist: Patient identified, Emergency Drugs available, Suction available and Patient being monitored Patient Re-evaluated:Patient Re-evaluated prior to induction Oxygen Delivery Method: Circle System Utilized Preoxygenation: Pre-oxygenation with 100% oxygen Induction Type: IV induction Ventilation: Mask ventilation without difficulty and Oral airway inserted - appropriate to patient size Laryngoscope Size: Mac and 4 Grade View: Grade II Tube type: Oral Tube size: 7.5 mm Number of attempts: 1 Airway Equipment and Method: Stylet and Oral airway Placement Confirmation: ETT inserted through vocal cords under direct vision,  positive ETCO2 and breath sounds checked- equal and bilateral Secured at: 23 cm Tube secured with: Tape Dental Injury: Teeth and Oropharynx as per pre-operative assessment  Comments: Placed by Reece Agar, SRNA under the supervision of CRNA and MDA

## 2019-07-23 NOTE — Progress Notes (Signed)
1620 Received pt from PACU, A&O x4. Right hip incision with Aquacel dressing dry and intact, c/o right hip pain, pain meds given.

## 2019-07-23 NOTE — Progress Notes (Signed)
PHARMACIST - PHYSICIAN ORDER COMMUNICATION  CONCERNING: P&T Medication Policy on Herbal Medications  DESCRIPTION:  This patient's order for:  Coenzyme Q  has been noted.  This product(s) is classified as an "herbal" or natural product. Due to a lack of definitive safety studies or FDA approval, nonstandard manufacturing practices, plus the potential risk of unknown drug-drug interactions while on inpatient medications, the Pharmacy and Therapeutics Committee does not permit the use of "herbal" or natural products of this type within [place].   ACTION TAKEN: The pharmacy department is unable to verify this order at this time and your patient has been informed of this safety policy. Please reevaluate patient's clinical condition at discharge and address if the herbal or natural product(s) should be resumed at that time.  

## 2019-07-23 NOTE — Anesthesia Preprocedure Evaluation (Signed)
Anesthesia Evaluation  Patient identified by MRN, date of birth, ID band Patient awake    Reviewed: Allergy & Precautions, NPO status , Patient's Chart, lab work & pertinent test results, reviewed documented beta blocker date and time   History of Anesthesia Complications Negative for: history of anesthetic complications  Airway Mallampati: IV  TM Distance: >3 FB Neck ROM: Full    Dental  (+) Dental Advisory Given   Pulmonary sleep apnea and Continuous Positive Airway Pressure Ventilation , Current Smoker and Patient abstained from smoking.,  bipap   breath sounds clear to auscultation       Cardiovascular hypertension, Pt. on medications and Pt. on home beta blockers (-) angina(-) Past MI and (-) CHF  Rhythm:Regular     Neuro/Psych PSYCHIATRIC DISORDERS Depression negative neurological ROS     GI/Hepatic negative GI ROS, (+) Hepatitis -  Endo/Other  Morbid obesity  Renal/GU negative Renal ROS     Musculoskeletal  (+) Arthritis ,   Abdominal   Peds  Hematology negative hematology ROS (+)   Anesthesia Other Findings   Reproductive/Obstetrics                             Anesthesia Physical Anesthesia Plan  ASA: III  Anesthesia Plan: General   Post-op Pain Management:    Induction: Intravenous  PONV Risk Score and Plan: 1 and Ondansetron and Dexamethasone  Airway Management Planned: Oral ETT  Additional Equipment: None  Intra-op Plan:   Post-operative Plan: Extubation in OR  Informed Consent: I have reviewed the patients History and Physical, chart, labs and discussed the procedure including the risks, benefits and alternatives for the proposed anesthesia with the patient or authorized representative who has indicated his/her understanding and acceptance.     Dental advisory given  Plan Discussed with: CRNA and Surgeon  Anesthesia Plan Comments:         Anesthesia  Quick Evaluation

## 2019-07-23 NOTE — Progress Notes (Signed)
Orthopedic Tech Progress Note Patient Details:  Jeremy Dudley 1975/11/22 [id number] called RN to let her know that patient is weigh is 139.7kg (307.1lb) and our Over Head Frame with Trapeze max pull down weight is 250. That she would have to order patient a BARIATRIC BED with TRAPEZE if patient really needed trapeze. Patient ID: Jeremy Dudley, male   DOB: 26-Oct-1975, 44 y.o.   MRN: [id number]   Donald Pore 07/23/2019, 6:05 PM

## 2019-07-23 NOTE — Plan of Care (Signed)

## 2019-07-23 NOTE — Progress Notes (Signed)
Patient has Home Unit CPAP. RT set up machine and patient is able to place himself on and off when he is ready.

## 2019-07-23 NOTE — Evaluation (Signed)
Physical Therapy Evaluation Patient Details Name: Jeremy Dudley MRN: [id number] DOB: November 03, 1975 Today's Date: 07/23/2019   History of Present Illness  Pt is a 44 y/o male admitted secondary to R THA, direct anterior. PMH includes sleep apnea on CPAP, HTN, hepatitis and current smoker.   Clinical Impression  Pt is s/p surgery above with deficits below. Pt limited in mobility tolerance secondary to pain and wooziness. Pt requiring mod A to sit at EOB this session. Educated about performing ankle pumps 20X/hour. Anticipate pt will progress well once pain controlled. Will continue to follow acutely to maximize functional mobility independence and safety.     Follow Up Recommendations Follow surgeon's recommendation for DC plan and follow-up therapies;Supervision for mobility/OOB    Equipment Recommendations  Rolling walker with 5" wheels;3in1 (PT) (bariatric RW and 3 in 1)    Recommendations for Other Services OT consult     Precautions / Restrictions Precautions Precautions: Fall Restrictions Weight Bearing Restrictions: Yes RLE Weight Bearing: Weight bearing as tolerated      Mobility  Bed Mobility Overal bed mobility: Needs Assistance Bed Mobility: Supine to Sit;Sit to Supine     Supine to sit: Mod assist Sit to supine: Mod assist   General bed mobility comments: Mod A for RLE assist and trunk assist. Increased time to come to sitting secondary to pain. Reports increased wooziness in sitting and increased pain, therefore further mobility deferred.   Transfers                    Ambulation/Gait                Stairs            Wheelchair Mobility    Modified Rankin (Stroke Patients Only)       Balance Overall balance assessment: Needs assistance Sitting-balance support: No upper extremity supported;Feet supported Sitting balance-Leahy Scale: Fair                                       Pertinent Vitals/Pain Pain Assessment:  Faces Faces Pain Scale: Hurts whole lot Pain Location: R hip  Pain Descriptors / Indicators: Aching;Grimacing;Guarding Pain Intervention(s): Monitored during session;Limited activity within patient's tolerance;Repositioned    Home Living Family/patient expects to be discharged to:: Private residence Living Arrangements: Spouse/significant other Available Help at Discharge: Family;Available 24 hours/day Type of Home: House Home Access: Ramped entrance     Home Layout: Two level;Able to live on main level with bedroom/bathroom Home Equipment: Shower seat;Wheelchair - power;Cane - single point;Walker - 4 wheels      Prior Function Level of Independence: Independent with assistive device(s)         Comments: Reports he has been ambulating very little. Uses 2 canes when ambulating to bathroom. Was using power chair for outside of home, but has been unable to use recently secondary to pain.      Hand Dominance        Extremity/Trunk Assessment   Upper Extremity Assessment Upper Extremity Assessment: Defer to OT evaluation    Lower Extremity Assessment Lower Extremity Assessment: RLE deficits/detail RLE Deficits / Details: Deficits consistent with post op pain and weakness. Very limited ROM secondary to pain        Communication   Communication: No difficulties  Cognition Arousal/Alertness: Awake/alert Behavior During Therapy: WFL for tasks assessed/performed Overall Cognitive Status: Within Functional Limits for tasks assessed  General Comments      Exercises Total Joint Exercises Ankle Circles/Pumps: AROM;Right;20 reps;Supine (educated to perform 20X/hour)   Assessment/Plan    PT Assessment Patient needs continued PT services  PT Problem List Decreased strength;Decreased activity tolerance;Decreased mobility;Decreased balance;Decreased range of motion;Decreased knowledge of use of DME;Pain       PT  Treatment Interventions DME instruction;Gait training;Functional mobility training;Therapeutic activities;Therapeutic exercise;Balance training;Patient/family education    PT Goals (Current goals can be found in the Care Plan section)  Acute Rehab PT Goals Patient Stated Goal: to decrease pain  PT Goal Formulation: With patient Time For Goal Achievement: 08/06/19 Potential to Achieve Goals: Good    Frequency 7X/week   Barriers to discharge        Co-evaluation               AM-PAC PT "6 Clicks" Mobility  Outcome Measure Help needed turning from your back to your side while in a flat bed without using bedrails?: A Little Help needed moving from lying on your back to sitting on the side of a flat bed without using bedrails?: A Lot Help needed moving to and from a bed to a chair (including a wheelchair)?: A Lot Help needed standing up from a chair using your arms (e.g., wheelchair or bedside chair)?: A Lot Help needed to walk in hospital room?: A Lot Help needed climbing 3-5 steps with a railing? : Total 6 Click Score: 12    End of Session   Activity Tolerance: Patient limited by pain Patient left: in bed;with call bell/phone within reach;with nursing/sitter in room Nurse Communication: Mobility status PT Visit Diagnosis: Unsteadiness on feet (R26.81);Muscle weakness (generalized) (M62.81);Difficulty in walking, not elsewhere classified (R26.2);Pain Pain - Right/Left: Right Pain - part of body: Hip    Time: 8115-7262 PT Time Calculation (min) (ACUTE ONLY): 16 min   Charges:   PT Evaluation $PT Eval Low Complexity: 1 Low          Cindee Salt, DPT  Acute Rehabilitation Services  Pager: [phone] Office: [phone]   Lehman Prom 07/23/2019, 6:14 PM

## 2019-07-23 NOTE — H&P (Signed)
TOTAL HIP ADMISSION H&P  Patient is admitted for right total hip arthroplasty.  Subjective:  Chief Complaint: right hip pain  HPI: Jeremy Dudley, 44 y.o. male, has a history of pain and functional disability in the right hip(s) due to arthritis and patient has failed non-surgical conservative treatments for greater than 12 weeks to include NSAID's and/or analgesics, corticosteriod injections, flexibility and strengthening excercises, supervised PT with diminished ADL's post treatment, use of assistive devices, weight reduction as appropriate and activity modification.  Onset of symptoms was gradual starting 3 years ago with gradually worsening course since that time.The patient noted no past surgery on the right hip(s).  Patient currently rates pain in the right hip at 10 out of 10 with activity. Patient has night pain, worsening of pain with activity and weight bearing, trendelenberg gait, pain that interfers with activities of daily living and pain with passive range of motion. Patient has evidence of subchondral cysts, subchondral sclerosis, periarticular osteophytes and joint space narrowing by imaging studies. This condition presents safety issues increasing the risk of falls.  There is no current active infection.  Patient Active Problem List   Diagnosis Date Noted  . Unilateral primary osteoarthritis, left hip 10/17/2018  . Unilateral primary osteoarthritis, right hip 10/17/2018   Past Medical History:  Diagnosis Date  . Depression   . Hepatitis    fatty liver  . History of kidney stones   . Hypertension   . Sleep apnea    bipap    Past Surgical History:  Procedure Laterality Date  . HERNIA REPAIR      Current Facility-Administered Medications  Medication Dose Route Frequency Provider Last Rate Last Admin  . ceFAZolin (ANCEF) 3 g in dextrose 5 % 50 mL IVPB  3 g Intravenous On Call to OR Kathryne Hitch, MD      . lactated ringers infusion   Intravenous Continuous Val Eagle, MD 10 mL/hr at 07/23/19 1123 Continued from Pre-op at 07/23/19 1123  . tranexamic acid (CYKLOKAPRON) 1000MG /127mL IVPB           . tranexamic acid (CYKLOKAPRON) IVPB 1,000 mg  1,000 mg Intravenous To OR 80m, PA-C       No Known Allergies  Social History   Tobacco Use  . Smoking status: Light Tobacco Smoker    Types: Cigars  . Smokeless tobacco: Never Used  Substance Use Topics  . Alcohol use: Yes    Comment: socially    History reviewed. No pertinent family history.   Review of Systems  All other systems reviewed and are negative.   Objective:  Physical Exam  Constitutional: He is oriented to person, place, and time.  HENT:  Head: Normocephalic and atraumatic.  Eyes: Pupils are equal, round, and reactive to light.  Cardiovascular: Normal rate and regular rhythm.  Respiratory: Effort normal.  GI: Soft. Normal appearance.  Musculoskeletal:     Cervical back: Normal range of motion and neck supple.     Right hip: Tenderness and bony tenderness present. Decreased range of motion. Decreased strength.  Neurological: He is alert and oriented to person, place, and time.  Skin: Skin is warm and dry.  Psychiatric: His behavior is normal. Mood normal.    Vital signs in last 24 hours: Temp:  [98.7 F (37.1 C)] 98.7 F (37.1 C) (06/22 1024) Pulse Rate:  [72] 72 (06/22 1024) Resp:  [20] 20 (06/22 1024) BP: (151)/(86) 151/86 (06/22 1024) SpO2:  [98 %] 98 % (06/22 1024) Weight:  [  139.7 kg] 139.7 kg (06/22 1024)  Labs:   Estimated body mass index is 52.87 kg/m as calculated from the following:   Height as of this encounter: 5\' 4"  (1.626 m).   Weight as of this encounter: 139.7 kg.   Imaging Review Plain radiographs demonstrate severe degenerative joint disease of the right hip(s). The bone quality appears to be good for age and reported activity level.      Assessment/Plan:  End stage arthritis, right hip(s)  The patient history, physical  examination, clinical judgement of the provider and imaging studies are consistent with end stage degenerative joint disease of the right hip(s) and total hip arthroplasty is deemed medically necessary. The treatment options including medical management, injection therapy, arthroscopy and arthroplasty were discussed at length. The risks and benefits of total hip arthroplasty were presented and reviewed. The risks due to aseptic loosening, infection, stiffness, dislocation/subluxation,  thromboembolic complications and other imponderables were discussed.  The patient acknowledged the explanation, agreed to proceed with the plan and consent was signed. Patient is being admitted for inpatient treatment for surgery, pain control, PT, OT, prophylactic antibiotics, VTE prophylaxis, progressive ambulation and ADL's and discharge planning.The patient is planning to be discharged home with home health services

## 2019-07-24 ENCOUNTER — Encounter (HOSPITAL_COMMUNITY): Payer: Self-pay | Admitting: Orthopaedic Surgery

## 2019-07-24 DIAGNOSIS — I10 Essential (primary) hypertension: Secondary | ICD-10-CM | POA: Diagnosis present

## 2019-07-24 DIAGNOSIS — M1611 Unilateral primary osteoarthritis, right hip: Secondary | ICD-10-CM | POA: Diagnosis present

## 2019-07-24 DIAGNOSIS — K759 Inflammatory liver disease, unspecified: Secondary | ICD-10-CM | POA: Diagnosis present

## 2019-07-24 DIAGNOSIS — K760 Fatty (change of) liver, not elsewhere classified: Secondary | ICD-10-CM | POA: Diagnosis present

## 2019-07-24 DIAGNOSIS — Z7982 Long term (current) use of aspirin: Secondary | ICD-10-CM | POA: Diagnosis not present

## 2019-07-24 DIAGNOSIS — F329 Major depressive disorder, single episode, unspecified: Secondary | ICD-10-CM | POA: Diagnosis present

## 2019-07-24 DIAGNOSIS — Z6841 Body Mass Index (BMI) 40.0 and over, adult: Secondary | ICD-10-CM | POA: Diagnosis not present

## 2019-07-24 DIAGNOSIS — Z79899 Other long term (current) drug therapy: Secondary | ICD-10-CM | POA: Diagnosis not present

## 2019-07-24 DIAGNOSIS — F17290 Nicotine dependence, other tobacco product, uncomplicated: Secondary | ICD-10-CM | POA: Diagnosis present

## 2019-07-24 DIAGNOSIS — G4730 Sleep apnea, unspecified: Secondary | ICD-10-CM | POA: Diagnosis present

## 2019-07-24 LAB — CBC
HCT: 34.1 % — ABNORMAL LOW (ref 39.0–52.0)
Hemoglobin: 11.1 g/dL — ABNORMAL LOW (ref 13.0–17.0)
MCH: 30.4 pg (ref 26.0–34.0)
MCHC: 32.6 g/dL (ref 30.0–36.0)
MCV: 93.4 fL (ref 80.0–100.0)
Platelets: 251 10*3/uL (ref 150–400)
RBC: 3.65 MIL/uL — ABNORMAL LOW (ref 4.22–5.81)
RDW: 12.9 % (ref 11.5–15.5)
WBC: 11.8 10*3/uL — ABNORMAL HIGH (ref 4.0–10.5)
nRBC: 0 % (ref 0.0–0.2)

## 2019-07-24 LAB — BASIC METABOLIC PANEL
Anion gap: 11 (ref 5–15)
BUN: 9 mg/dL (ref 6–20)
CO2: 27 mmol/L (ref 22–32)
Calcium: 8.9 mg/dL (ref 8.9–10.3)
Chloride: 101 mmol/L (ref 98–111)
Creatinine, Ser: 0.98 mg/dL (ref 0.61–1.24)
GFR calc Af Amer: >60 mL/min (ref >60)
GFR calc non Af Amer: >60 mL/min (ref >60)
Glucose, Bld: 158 mg/dL — ABNORMAL HIGH (ref 70–99)
Potassium: 4 mmol/L (ref 3.5–5.1)
Sodium: 139 mmol/L (ref 135–145)

## 2019-07-24 NOTE — Evaluation (Signed)
Occupational Therapy Evaluation Patient Details Name: Jeremy Dudley MRN: [id number] DOB: [birth year] Today's Date: 07/24/2019    History of Present Illness Pt is a 44 y/o male admitted secondary to R THA, direct anterior. PMH includes sleep apnea on CPAP, HTN, hepatitis and current smoker.    Clinical Impression   Pt admitted with the above diagnoses and presents with below problem list. Pt will benefit from continued acute OT to address the below listed deficits and maximize independence with basic ADLs prior to d/c home. PTA pt was utilizing AD for mobility and limited to bathroom distances only, spouse assisted with LB ADLs. Pt currently min guard A to mobilize in the room utilizing a bariatric rw, mod A with LB ADLs, min A with bed mobility. Tolerated session fairly well and agreeable to walk with PT at end of OT session.       Follow Up Recommendations  Home health OT    Equipment Recommendations  3 in 1 bedside commode;Other (comment) (needs bariatric)    Recommendations for Other Services       Precautions / Restrictions Precautions Precautions: Fall Restrictions Weight Bearing Restrictions: Yes RLE Weight Bearing: Weight bearing as tolerated      Mobility Bed Mobility Overal bed mobility: Needs Assistance Bed Mobility: Sidelying to Sit Rolling: Mod assist (to the L.) Sidelying to sit: Min assist Supine to sit: Mod assist     General bed mobility comments: pt utilizing bed rail to power hips and LE towards EOB. min steaying A with pt heavily utilizing bed rail and UB strength to power up trunk. Extra time and effort  Transfers Overall transfer level: Needs assistance Equipment used: Rolling walker (2 wheeled) Transfers: Sit to/from Stand Sit to Stand: Min assist;Min guard         General transfer comment: slightly elevated bed height. to/from EOB. cues for hand placement. light assist to steady    Balance Overall balance assessment: Needs  assistance Sitting-balance support: No upper extremity supported;Feet supported Sitting balance-Leahy Scale: Fair     Standing balance support: Bilateral upper extremity supported;During functional activity Standing balance-Leahy Scale: Poor Standing balance comment: external assist in static stand beyond 5 seconds                           ADL either performed or assessed with clinical judgement   ADL Overall ADL's : Needs assistance/impaired Eating/Feeding: Set up;Sitting   Grooming: Set up;Sitting   Upper Body Bathing: Set up;Sitting   Lower Body Bathing: Moderate assistance;Sit to/from stand   Upper Body Dressing : Set up;Sitting   Lower Body Dressing: Moderate assistance;Sit to/from stand   Toilet Transfer: Minimal assistance;Ambulation;RW   Toileting- Clothing Manipulation and Hygiene: Moderate assistance;Sit to/from stand       Functional mobility during ADLs: Min guard;Rolling walker General ADL Comments: Pt completed bed mobility, walked in the room. Difficulty accessing RLE in seated position. Discussed AE and functional exercise of reaching down that leg a couple of times each day to work on access.      Vision         Perception     Praxis      Pertinent Vitals/Pain Pain Assessment: 0-10 Pain Score: 4  Faces Pain Scale: Hurts little more Pain Location: R hip/thigh/knee Pain Descriptors / Indicators: Aching;Grimacing;Guarding Pain Intervention(s): Premedicated before session;Limited activity within patient's tolerance;Monitored during session;Repositioned     Hand Dominance     Extremity/Trunk Assessment Upper Extremity Assessment Upper Extremity  Assessment: Generalized weakness;Overall [facility] for tasks assessed   Lower Extremity Assessment Lower Extremity Assessment: Defer to PT evaluation       Communication Communication Communication: No difficulties   Cognition Arousal/Alertness: Awake/alert Behavior During Therapy: WFL for  tasks assessed/performed Overall Cognitive Status: Within Functional Limits for tasks assessed                                     General Comments       Exercises Total Joint Exercises Ankle Circles/Pumps: AROM;Both;20 reps;Supine Quad Sets: AROM;Right;10 reps;Supine Heel Slides: AAROM;Right;10 reps;Supine Hip ABduction/ADduction: AAROM;Right;10 reps;Supine Long Arc Quad: AROM;5 reps;Seated;Right   Shoulder Instructions      Home Living Family/patient expects to be discharged to:: Private residence Living Arrangements: Spouse/significant other Available Help at Discharge: Family;Available 24 hours/day Type of Home: House Home Access: Ramped entrance     Home Layout: Two level;Able to live on main level with bedroom/bathroom     Bathroom Shower/Tub: Producer, television/film/video: Standard     Home Equipment: Shower seat;Wheelchair - power;Cane - single point;Walker - 4 wheels          Prior Functioning/Environment Level of Independence: Independent with assistive device(s);Needs assistance    ADL's / Homemaking Assistance Needed: spouse has been assisting with LB ADLs due to hip pain    Comments: Reports he has been ambulating very little. Uses 2 canes when ambulating to bathroom. Was using power chair for outside of home, but has been unable to use recently secondary to pain.         OT Problem List: Decreased strength;Decreased activity tolerance;Impaired balance (sitting and/or standing);Decreased knowledge of use of DME or AE;Decreased knowledge of precautions;Obesity;Pain      OT Treatment/Interventions: Self-care/ADL training;Therapeutic exercise;Energy conservation;DME and/or AE instruction;Therapeutic activities;Patient/family education;Balance training    OT Goals(Current goals can be found in the care plan section) Acute Rehab OT Goals Patient Stated Goal: to decrease pain, improve mobility, regain independence OT Goal Formulation:  With patient Time For Goal Achievement: 08/07/19 Potential to Achieve Goals: Good ADL Goals Pt Will Perform Lower Body Bathing: with min guard assist;with adaptive equipment;sit to/from stand Pt Will Perform Lower Body Dressing: with min guard assist;with adaptive equipment;sit to/from stand Pt Will Transfer to Toilet: with supervision;ambulating Pt Will Perform Toileting - Clothing Manipulation and hygiene: with min guard assist;sit to/from stand  OT Frequency: Min 2X/week   Barriers to D/C:            Co-evaluation              AM-PAC OT "6 Clicks" Daily Activity     Outcome Measure Help from another person eating meals?: None Help from another person taking care of personal grooming?: None Help from another person toileting, which includes using toliet, bedpan, or urinal?: A Lot Help from another person bathing (including washing, rinsing, drying)?: A Lot Help from another person to put on and taking off regular upper body clothing?: None Help from another person to put on and taking off regular lower body clothing?: A Lot 6 Click Score: 18   End of Session Equipment Utilized During Treatment: Rolling walker  Activity Tolerance: Patient tolerated treatment well Patient left: Other (comment) (sitting EOB with PT arriving for start of PT session)  OT Visit Diagnosis: Unsteadiness on feet (R26.81);Muscle weakness (generalized) (M62.81);Pain  Time: 1561-5379 OT Time Calculation (min): 20 min Charges:  OT General Charges $OT Visit: 1 Visit OT Evaluation $OT Eval Low Complexity: [place], OT Acute Rehabilitation Services Pager: [phone] Office: [phone]   Hortencia Pilar 07/24/2019, 2:15 PM

## 2019-07-24 NOTE — Discharge Instructions (Signed)
INSTRUCTIONS AFTER JOINT REPLACEMENT   o Remove items at home which could result in a fall. This includes throw rugs or furniture in walking pathways o ICE to the affected joint every three hours while awake for 30 minutes at a time, for at least the first 3-5 days, and then as needed for pain and swelling.  Continue to use ice for pain and swelling. You may notice swelling that will progress down to the foot and ankle.  This is normal after surgery.  Elevate your leg when you are not up walking on it.   o Continue to use the breathing machine you got in the hospital (incentive spirometer) which will help keep your temperature down.  It is common for your temperature to cycle up and down following surgery, especially at night when you are not up moving around and exerting yourself.  The breathing machine keeps your lungs expanded and your temperature down.   DIET:  As you were doing prior to hospitalization, we recommend a well-balanced diet.  DRESSING / WOUND CARE / SHOWERING  Keep the surgical dressing until follow up.  The dressing is water proof, so you can shower without any extra covering.  IF THE DRESSING FALLS OFF or the wound gets wet inside, change the dressing with sterile gauze.  Please use good hand washing techniques before changing the dressing.  Do not use any lotions or creams on the incision until instructed by your surgeon.    ACTIVITY  o Increase activity slowly as tolerated, but follow the weight bearing instructions below.   o No driving for 6 weeks or until further direction given by your physician.  You cannot drive while taking narcotics.  o No lifting or carrying greater than 10 lbs. until further directed by your surgeon. o Avoid periods of inactivity such as sitting longer than an hour when not asleep. This helps prevent blood clots.  o You may return to work once you are authorized by your doctor.     WEIGHT BEARING   Weight bearing as tolerated with assist  device (walker, cane, etc) as directed, use it as long as suggested by your surgeon or therapist, typically at least 4-6 weeks.   EXERCISES  Results after joint replacement surgery are often greatly improved when you follow the exercise, range of motion and muscle strengthening exercises prescribed by your doctor. Safety measures are also important to protect the joint from further injury. Any time any of these exercises cause you to have increased pain or swelling, decrease what you are doing until you are comfortable again and then slowly increase them. If you have problems or questions, call your caregiver or physical therapist for advice.   Rehabilitation is important following a joint replacement. After just a few days of immobilization, the muscles of the leg can become weakened and shrink (atrophy).  These exercises are designed to build up the tone and strength of the thigh and leg muscles and to improve motion. Often times heat used for twenty to thirty minutes before working out will loosen up your tissues and help with improving the range of motion but do not use heat for the first two weeks following surgery (sometimes heat can increase post-operative swelling).   These exercises can be done on a training (exercise) mat, on the floor, on a table or on a bed. Use whatever works the best and is most comfortable for you.    Use music or television while you are exercising so that   the exercises are a pleasant break in your day. This will make your life better with the exercises acting as a break in your routine that you can look forward to.   Perform all exercises about fifteen times, three times per day or as directed.  You should exercise both the operative leg and the other leg as well.  Exercises include:   . Quad Sets - Tighten up the muscle on the front of the thigh (Quad) and hold for 5-10 seconds.   . Straight Leg Raises - With your knee straight (if you were given a brace, keep it on),  lift the leg to 60 degrees, hold for 3 seconds, and slowly lower the leg.  Perform this exercise against resistance later as your leg gets stronger.  . Leg Slides: Lying on your back, slowly slide your foot toward your buttocks, bending your knee up off the floor (only go as far as is comfortable). Then slowly slide your foot back down until your leg is flat on the floor again.  . Angel Wings: Lying on your back spread your legs to the side as far apart as you can without causing discomfort.  . Hamstring Strength:  Lying on your back, push your heel against the floor with your leg straight by tightening up the muscles of your buttocks.  Repeat, but this time bend your knee to a comfortable angle, and push your heel against the floor.  You may put a pillow under the heel to make it more comfortable if necessary.   A rehabilitation program following joint replacement surgery can speed recovery and prevent re-injury in the future due to weakened muscles. Contact your doctor or a physical therapist for more information on knee rehabilitation.    CONSTIPATION  Constipation is defined medically as fewer than three stools per week and severe constipation as less than one stool per week.  Even if you have a regular bowel pattern at home, your normal regimen is likely to be disrupted due to multiple reasons following surgery.  Combination of anesthesia, postoperative narcotics, change in appetite and fluid intake all can affect your bowels.   YOU MUST use at least one of the following options; they are listed in order of increasing strength to get the job done.  They are all available over the counter, and you may need to use some, POSSIBLY even all of these options:    Drink plenty of fluids (prune juice may be helpful) and high fiber foods Colace 100 mg by mouth twice a day  Senokot for constipation as directed and as needed Dulcolax (bisacodyl), take with full glass of water  Miralax (polyethylene glycol)  once or twice a day as needed.  If you have tried all these things and are unable to have a bowel movement in the first 3-4 days after surgery call either your surgeon or your primary doctor.    If you experience loose stools or diarrhea, hold the medications until you stool forms back up.  If your symptoms do not get better within 1 week or if they get worse, check with your doctor.  If you experience "the worst abdominal pain ever" or develop nausea or vomiting, please contact the office immediately for further recommendations for treatment.   ITCHING:  If you experience itching with your medications, try taking only a single pain pill, or even half a pain pill at a time.  You can also use Benadryl over the counter for itching or also to   help with sleep.   TED HOSE STOCKINGS:  Use stockings on both legs until for at least 2 weeks or as directed by physician office. They may be removed at night for sleeping.  MEDICATIONS:  See your medication summary on the "After Visit Summary" that nursing will review with you.  You may have some home medications which will be placed on hold until you complete the course of blood thinner medication.  It is important for you to complete the blood thinner medication as prescribed.  PRECAUTIONS:  If you experience chest pain or shortness of breath - call 911 immediately for transfer to the hospital emergency department.   If you develop a fever greater that 101 F, purulent drainage from wound, increased redness or drainage from wound, foul odor from the wound/dressing, or calf pain - CONTACT YOUR SURGEON.                                                   FOLLOW-UP APPOINTMENTS:  If you do not already have a post-op appointment, please call the office for an appointment to be seen by your surgeon.  Guidelines for how soon to be seen are listed in your "After Visit Summary", but are typically between 1-4 weeks after surgery.  OTHER INSTRUCTIONS:   Knee  Replacement:  Do not place pillow under knee, focus on keeping the knee straight while resting. CPM instructions: 0-90 degrees, 2 hours in the morning, 2 hours in the afternoon, and 2 hours in the evening. Place foam block, curve side up under heel at all times except when in CPM or when walking.  DO NOT modify, tear, cut, or change the foam block in any way.   DENTAL ANTIBIOTICS:  In most cases prophylactic antibiotics for Dental procdeures after total joint surgery are not necessary.  Exceptions are as follows:  1. History of prior total joint infection  2. Severely immunocompromised (Organ Transplant, cancer chemotherapy, Rheumatoid biologic meds such as Humera)  3. Poorly controlled diabetes (A1C &gt; 8.0, blood glucose over 200)  If you have one of these conditions, contact your surgeon for an antibiotic prescription, prior to your dental procedure. Dental Antibiotics:  In most cases prophylactic antibiotics for Dental procdeures after total joint surgery are not necessary.  Exceptions are as follows:  1. History of prior total joint infection  2. Severely immunocompromised (Organ Transplant, cancer chemotherapy, Rheumatoid biologic meds such as Humera)  3. Poorly controlled diabetes (A1C &gt; 8.0, blood glucose over 200)  If you have one of these conditions, contact your surgeon for an antibiotic prescription, prior to your . dental procedure.Understand these instructions.  . Get help right away if you are not doing well or get worse.    Thank you for letting us be a part of your medical care team.  It is a privilege we respect greatly.  We hope these instructions will help you stay on track for a fast and full recovery!

## 2019-07-24 NOTE — Op Note (Signed)
NAMEELIESER, TETRICK MEDICAL RECORD ZO:[id number] ACCOUNT [id number] DATE OF BIRTH:1975/10/08 FACILITY: [facility] LOCATION: [facility] PHYSICIAN:Tex Conroy Aretha Parrot, MD  OPERATIVE REPORT  DATE OF PROCEDURE:  07/23/2019  PREOPERATIVE DIAGNOSIS:  Primary osteoarthritis and degenerative joint disease, right hip.  POSTOPERATIVE DIAGNOSIS:  Primary osteoarthritis and degenerative joint disease, right hip.  PROCEDURE:  Right total hip arthroplasty through direct anterior approach.  IMPLANTS:  DePuy Sector Gription acetabular component size 52, size 36+4 neutral polyethylene liner, size 11 Corail femoral component with standard offset, size 36+1.5 ceramic hip ball.  SURGEON:  Vanita Panda. Magnus Ivan, MD  ASSISTANT:  Richardean Canal, PA-C  ANESTHESIA:  General.  ANTIBIOTICS:  Three grams IV Ancef.  ESTIMATED BLOOD LOSS:  300 mL.  COMPLICATIONS:  None.  INDICATIONS:  Jeremy Dudley is a 44 year old gentleman well known to me.  He is only 5 feet 4 with a BMI of 53, but he has debilitating arthritis involving both his hips and they are basically melting away.  He walks with walking sticks.  He has tried to lose a  considerable amount of weight, which he has.  I have examined him on the exam table, lying supine, to see if we can get to his hips.  At this point, he has plateaued with his weight loss and his hips or so awful that we feel that anterior hip surgery  will help him achieve his weight loss goals.  Also, our goal is to decrease pain, improve mobility and overall improve quality of life.  DESCRIPTION OF PROCEDURE:  After informed consent was obtained and appropriate right hip was marked he was brought to the operating room and general anesthesia was obtained while he was on a stretcher.  We placed traction boots on both his feet.  He was  placed supine on the Hana fracture table, the perineal post in place and both legs in line skeletal traction device and no traction applied.  His right operative hip  was prepped and draped with DuraPrep and sterile drapes.  A timeout was called and he  was identified as correct patient, correct right hip.  We placed him supine on the operating table.  His right hip was prepped and draped with DuraPrep and sterile drapes.  A time-out was called.  He was identified as correct patient, correct right hip.   We then made an incision just inferior and posterior to the anterior superior iliac spine and carried this obliquely down the leg.  We dissected down tensor fascia lata muscle.  Tensor fascia was then divided longitudinally to proceed with direct  anterior approach to the hip.  We identified and cauterized circumflex vessels and identified the hip capsule.  We opened the hip capsule in an L-type format finding a moderate joint effusion and significant deformity around the femoral head and neck.   We then made our femoral neck cut within the hip joint capsule with an oscillating saw just proximal to the lesser trochanter.  Completed this with an osteotome and placed a corkscrew guide in the femoral head and removed the femoral head in its entirety  and found a wide area devoid of cartilage.  We then placed a bent Hohmann over the medial acetabular rim and removed remnants of acetabular labrum and other debris from the hip socket.  We then began reaming under direct visualization in 1 mm increments  from a size 43 going up to a size 51.  With all reamers placed under direct visualization, the last reamer was also placed under direct fluoroscopy,  so we could obtain our depth in reaming, our inclination, and anteversion.  I then placed the real DePuy  Sector Gription acetabular component size 52 and a 36+4 neutral polyethylene liner for that size 52 acetabular component.  Attention was then turned to the femur.  With the leg externally rotated to 120 degrees, extended and adducted, we replaced  Mueller retractor medially and Hohman retractor behind the greater trochanter,  released lateral joint capsule and used a box-cutting osteotome to enter the femoral canal and a rongeur to lateralize.  Then began broaching using the Corail broaching system  from a size 8 going up to a size 11.  With a size 11 in place, we trialed a standard offset femoral neck and a 36+1.5 hip ball.  We brought the leg back over and up and with traction and internal rotation, reduced it in the pelvis.  We were pleased with  the leg length, offset, range of motion and stability assessed radiographically and mechanically.  We then dislocated the hip and removed the trial components.  I placed the real Corail femoral component with standard offset size 11 and the real 36+1.5  ceramic hip ball and reduced this in the acetabulum and again we were pleased with stability.  We then irrigated the soft tissue with normal saline solution using pulsatile lavage.  We were able to close the joint capsule with interrupted #1 Ethibond  suture, followed by closing the tensor fascia with #1 Vicryl.  0 Vicryl was used to close deep tissue, 2-0 Vicryl was used to close subcutaneous tissue.  Staples were used to close the skin.  Xeroform and Aquacel dressing was applied.  He was taken off  the Hana table, awakened, extubated, and taken to recovery room in stable condition.  All final counts were correct.  There were no complications noted.  Of note, Benita Stabile, PA-C, assisted during the entire case.  His assistance was crucial for  facilitating all aspects of this case.  CN/NUANCE  D:07/23/2019 T:07/24/2019 JOB:[id number]

## 2019-07-24 NOTE — Anesthesia Postprocedure Evaluation (Signed)
Anesthesia Post Note  Patient: Jeremy Dudley  Procedure(s) Performed: RIGHT TOTAL HIP ARTHROPLASTY ANTERIOR APPROACH (Right Hip)     Patient location during evaluation: PACU Anesthesia Type: General Level of consciousness: awake and patient cooperative Pain management: pain level controlled Vital Signs Assessment: post-procedure vital signs reviewed and stable Respiratory status: spontaneous breathing, nonlabored ventilation, respiratory function stable and patient connected to nasal cannula oxygen Cardiovascular status: blood pressure returned to baseline and stable Postop Assessment: no apparent nausea or vomiting Anesthetic complications: no   Encounter Complications  Complication Outcome Phase Comment  Agitation requiring restraint or medication  Intraprocedure pt woke up abruptly, agitated and disoriented. Precedex given, pt reoriented and calm    Last Vitals:  Vitals:   07/24/19 1320 07/24/19 1536  BP: (!) 118/51 (!) 131/58  Pulse: 73 71  Resp: 18 18  Temp: 37 C 37.1 C  SpO2: 98% 99%    Last Pain:  Vitals:   07/24/19 1750  TempSrc:   PainSc: 7                  Danyah Guastella

## 2019-07-24 NOTE — TOC Initial Note (Signed)
Transition of Care [facility]) - Initial/Assessment Note    Patient Details  Name: Jeremy Dudley MRN: [id number] Date of Birth: [birth year]  [facility]) CM/SW Contact:    Janae Bridgeman, RN Phone Number: 07/24/2019, 4:06 PM  Clinical Narrative:                 Case Management met with the patient, S/P Right total hip arthroplasty.  Patient lives at home with his wife.  He currently has a rollator.  The patient was given Medicare choice by the physician office and was set up with [facility].  [employer] was called and a bariatric walker and 3:1 was ordered.  Expected Discharge Plan: Home w Home Health Services Barriers to Discharge: Continued Medical Work up   Patient Goals and CMS Choice Patient states their goals for this hospitalization and ongoing recovery are:: Patient states that he is doing well and will be ready to go home towards the end of the week. CMS Medicare.gov Compare Post Acute Care list provided to:: Patient Choice offered to / list presented to : Patient  Expected Discharge Plan and Services Expected Discharge Plan: Home w Home Health Services   Discharge Planning Services: CM Consult Post Acute Care Choice: Durable Medical Equipment, Home Health Living arrangements for the past 2 months: Single Family Home                 DME Arranged: 3-N-1, Walker rolling DME Agency: [employer] Date DME Agency Contacted: 07/24/19 Time DME Agency Contacted: [phone] Representative spoke with at DME Agency: Timothy Lasso, [employer] HH Arranged: PT HH Agency: [facility] (formerly [facility]) Date HH Agency Contacted: 07/24/19 Time HH Agency Contacted: [id number] Representative spoke with at [facility] Agency: [facility]  Prior Living Arrangements/Services Living arrangements for the past 2 months: Single Family Home Lives with:: Spouse Patient language and need for interpreter reviewed:: Yes Do you feel safe going back to the place where you live?: Yes      Need for  Family Participation in Patient Care: Yes (Comment) Care giver support system in place?: Yes (comment) Current home services: DME (Rolator at home) Criminal Activity/Legal Involvement Pertinent to Current Situation/Hospitalization: No - Comment as needed  Activities of Daily Living Home Assistive Devices/Equipment: Cane (specify quad or straight), Wheelchair, Environmental consultant (specify type) ADL Screening (condition at time of admission) Patient's cognitive ability adequate to safely complete daily activities?: Yes Is the patient deaf or have difficulty hearing?: No Does the patient have difficulty seeing, even when wearing glasses/contacts?: No Does the patient have difficulty concentrating, remembering, or making decisions?: No Patient able to express need for assistance with ADLs?: Yes Does the patient have difficulty dressing or bathing?: No Independently performs ADLs?: Yes (appropriate for developmental age) Does the patient have difficulty walking or climbing stairs?: Yes Weakness of Legs: Right Weakness of Arms/Hands: None  Permission Sought/Granted Permission sought to share information with : Case Manager Permission granted to share information with : Yes, Verbal Permission Granted     Permission granted to share info w AGENCY: [facility], [facility]        Emotional Assessment Appearance:: Appears stated age Attitude/Demeanor/Rapport: Engaged Affect (typically observed): Accepting Orientation: : Oriented to Self, Oriented to Place, Oriented to  Time, Oriented to Situation Alcohol / Substance Use: Not Applicable Psych Involvement: No (comment)  Admission diagnosis:  Status post total replacement of right hip [Z96.641] Patient Active Problem List   Diagnosis Date Noted  . Status post total  replacement of right hip 07/23/2019  . Unilateral primary osteoarthritis, left hip 10/17/2018  . Unilateral primary osteoarthritis, right hip 10/17/2018   PCP:  Abran Richard, MD Pharmacy:    [place], [place] - [address] [place] [address] Phone: [phone] Fax: [phone]     Social Determinants of Health (SDOH) Interventions    Readmission Risk Interventions Readmission Risk Prevention Plan 07/24/2019  Post Dischage Appt Complete  Medication Screening Complete  Transportation Screening Complete  Some recent data might be hidden

## 2019-07-24 NOTE — Plan of Care (Signed)
  Problem: Education: Goal: Knowledge of General Education information will improve Description: Including pain rating scale, medication(s)/side effects and non-pharmacologic comfort measures Outcome: Progressing   Problem: Clinical Measurements: Goal: Respiratory complications will improve Outcome: Progressing   Problem: Activity: Goal: Risk for activity intolerance will decrease Outcome: Progressing   Problem: Nutrition: Goal: Adequate nutrition will be maintained Outcome: Progressing   Problem: Coping: Goal: Level of anxiety will decrease Outcome: Progressing   Problem: Elimination: Goal: Will not experience complications related to urinary retention Outcome: Progressing   Problem: Safety: Goal: Ability to remain free from injury will improve Outcome: Progressing   

## 2019-07-24 NOTE — Progress Notes (Signed)
Physical Therapy Treatment Patient Details Name: Jeremy Dudley MRN: [id number] DOB: [birth year] Today's Date: 07/24/2019    History of Present Illness Pt is a 44 y/o male admitted secondary to R THA, direct anterior. PMH includes sleep apnea on CPAP, HTN, hepatitis and current smoker.     PT Comments    Pt soiled in urine on arrival from spilling of urinal. Assisted patient with bathing and changing gowns.  Pt performed RLE exercises with cues for encouragement and assistance to complete ROM.  He moved to sitting and standing. Pt required external assistance for all mobilities.  Will f/u in pm to progress function.  Pt reports being bed bound for 1/2 a year.  He has little confidence in his abilities to mobilize.      Follow Up Recommendations  Follow surgeons recommendation for DC plan and follow-up therapies;Supervision for mobility/OOB     Equipment Recommendations  Rolling walker with 5" wheels;3in1 (PT) (bariatric sizes needed due to body habitus)    Recommendations for Other Services       Precautions / Restrictions Precautions Precautions: Fall Restrictions Weight Bearing Restrictions: Yes RLE Weight Bearing: Weight bearing as tolerated    Mobility  Bed Mobility Overal bed mobility: Needs Assistance Bed Mobility: Supine to Sit;Sit to Supine;Rolling Rolling: Mod assist (to the L.)   Supine to sit: Mod assist     General bed mobility comments: Pt required assistance with lower body to roll into sidelying for pericare.  HOB elevated to come to sitting with cues for hand placement.  Use of bed pad to boost hips to edge of bed.  Pt able to manage trunk elevation into sitting with heavy use of railing.  Transfers Overall transfer level: Needs assistance Equipment used: Rolling walker (2 wheeled) (bariatric.) Transfers: Sit to/from Stand Sit to Stand: Min assist         General transfer comment: Cues for hand placement to and from seated surface.  Pt stood from  elevated surface.  Presents with decreased flexion of R knee due to pain.  Ambulation/Gait Ambulation/Gait assistance: Min guard Gait Distance (Feet): 5 Feet Assistive device: Rolling walker (2 wheeled) Gait Pattern/deviations: Step-to pattern;Shuffle;Trunk flexed     General Gait Details: Decreased flexion in B knees.  Pt apprehensive to progress gt distance. Heavy reliance on B UEs for standing/ambulation.   Stairs             Wheelchair Mobility    Modified Rankin (Stroke Patients Only)       Balance Overall balance assessment: Needs assistance Sitting-balance support: No upper extremity supported;Feet supported Sitting balance-Leahy Scale: Fair       Standing balance-Leahy Scale: Poor                              Cognition Arousal/Alertness: Awake/alert Behavior During Therapy: WFL for tasks assessed/performed Overall Cognitive Status: Within Functional Limits for tasks assessed                                        Exercises Total Joint Exercises Ankle Circles/Pumps: AROM;Both;20 reps;Supine Quad Sets: AROM;Right;10 reps;Supine Heel Slides: AAROM;Right;10 reps;Supine Hip ABduction/ADduction: AAROM;Right;10 reps;Supine Long Arc Quad: AROM;5 reps;Seated;Right    General Comments        Pertinent Vitals/Pain Pain Assessment: 0-10 Faces Pain Scale: Hurts little more Pain Location: R hip/thigh/knee Pain Descriptors / Indicators: Aching;Grimacing;Guarding  Pain Intervention(s): Repositioned;Ice applied    Home Living                      Prior Function            PT Goals (current goals can now be found in the care plan section) Acute Rehab PT Goals Patient Stated Goal: to decrease pain  Potential to Achieve Goals: Good Progress towards PT goals: Progressing toward goals    Frequency    7X/week      PT Plan Current plan remains appropriate    Co-evaluation              AM-PAC PT "6  Clicks" Mobility   Outcome Measure  Help needed turning from your back to your side while in a flat bed without using bedrails?: A Little Help needed moving from lying on your back to sitting on the side of a flat bed without using bedrails?: A Lot Help needed moving to and from a bed to a chair (including a wheelchair)?: A Lot Help needed standing up from a chair using your arms (e.g., wheelchair or bedside chair)?: A Lot Help needed to walk in hospital room?: A Lot Help needed climbing 3-5 steps with a railing? : Total 6 Click Score: 12    End of Session Equipment Utilized During Treatment: Gait belt Activity Tolerance: Patient limited by pain Patient left: in bed;with call bell/phone within reach;with nursing/sitter in room Nurse Communication: Mobility status PT Visit Diagnosis: Unsteadiness on feet (R26.81);Muscle weakness (generalized) (M62.81);Difficulty in walking, not elsewhere classified (R26.2);Pain Pain - Right/Left: Right Pain - part of body: Hip     Time: 5732-2025 PT Time Calculation (min) (ACUTE ONLY): 42 min  Charges:  $Gait Training: 8-22 mins $Therapeutic Exercise: 8-22 mins $Therapeutic Activity: 8-22 mins                     Erasmo Leventhal , PTA Acute Rehabilitation Services Pager [phone] Office [phone]     [name] [name] 07/24/2019, 10:33 AM

## 2019-07-24 NOTE — Progress Notes (Signed)
Physical Therapy Treatment Patient Details Name: Jeremy Dudley MRN: [id number] DOB: 06/04/1975 Today's Date: 07/24/2019    History of Present Illness Pt is a 44 y/o male admitted secondary to R THA, direct anterior. PMH includes sleep apnea on CPAP, HTN, hepatitis and current smoker.     PT Comments    Pt fininshing session with OT on arrival.  He was sitting edge of bed.  Pt performed transfers and progression of gt training this pm.  He reports this is the first time he has walked this far in years.  Plan for continued PT to maximize functional gains before returning home.    Follow Up Recommendations  Follow surgeon's recommendation for DC plan and follow-up therapies;Supervision for mobility/OOB     Equipment Recommendations  Rolling walker with 5" wheels;3in1 (PT)    Recommendations for Other Services       Precautions / Restrictions Precautions Precautions: Fall Restrictions Weight Bearing Restrictions: Yes RLE Weight Bearing: Weight bearing as tolerated    Mobility  Bed Mobility Overal bed mobility: Needs Assistance Bed Mobility: Sit to Supine   Sidelying to sit: Min assist   Sit to supine: Mod assist   General bed mobility comments: Mod assistance to lift RLE back to bed against gravity.  Once in bed assisted in patient positioning with cues to scoot to [facility] with bed level tilted.  Transfers Overall transfer level: Needs assistance Equipment used: Rolling walker (2 wheeled) Transfers: Sit to/from Stand Sit to Stand: Min assist;Min guard         General transfer comment: slightly elevated bed height. to/from EOB. cues for hand placement. light assist to steady  Ambulation/Gait Ambulation/Gait assistance: Min guard Gait Distance (Feet): 60 Feet Assistive device: Rolling walker (2 wheeled) Gait Pattern/deviations: Step-to pattern;Shuffle;Trunk flexed     General Gait Details: Pt more motivated to progress gt distance this pm.  Pt continues to rely on RW  for UE support in standing.   Stairs             Wheelchair Mobility    Modified Rankin (Stroke Patients Only)       Balance Overall balance assessment: Needs assistance Sitting-balance support: No upper extremity supported;Feet supported Sitting balance-Leahy Scale: Fair     Standing balance support: Bilateral upper extremity supported;During functional activity Standing balance-Leahy Scale: Poor Standing balance comment: external assist in static stand beyond 5 seconds                            Cognition Arousal/Alertness: Awake/alert Behavior During Therapy: WFL for tasks assessed/performed;Anxious Overall Cognitive Status: Within Functional Limits for tasks assessed                                 General Comments: Less anxious then this day      Exercises Total Joint Exercises Ankle Circles/Pumps: AROM;Both;20 reps;Supine Quad Sets: AROM;Right;10 reps;Supine Heel Slides: AAROM;Right;10 reps;Supine Hip ABduction/ADduction: AAROM;Right;10 reps;Supine    General Comments        Pertinent Vitals/Pain Pain Assessment: 0-10 Pain Score: 6  Pain Location: R hip/thigh/knee Pain Descriptors / Indicators: Aching;Grimacing;Guarding Pain Intervention(s): Monitored during session;Repositioned    Home Living Family/patient expects to be discharged to:: Private residence Living Arrangements: Spouse/significant other Available Help at Discharge: Family;Available 24 hours/day Type of Home: House Home Access: Ramped entrance   Home Layout: Two level;Able to live on main level with bedroom/bathroom Home  Equipment: Information systems manager;Wheelchair - power;Cane - single point;Walker - 4 wheels      Prior Function Level of Independence: Independent with assistive device(s);Needs assistance    ADL's / Homemaking Assistance Needed: spouse has been assisting with LB ADLs due to hip pain  Comments: Reports he has been ambulating very little. Uses 2  canes when ambulating to bathroom. Was using power chair for outside of home, but has been unable to use recently secondary to pain.    PT Goals (current goals can now be found in the care plan section) Acute Rehab PT Goals Patient Stated Goal: to decrease pain, improve mobility, regain independence Potential to Achieve Goals: Good Progress towards PT goals: Progressing toward goals    Frequency    7X/week      PT Plan Current plan remains appropriate    Co-evaluation              AM-PAC PT "6 Clicks" Mobility   Outcome Measure  Help needed turning from your back to your side while in a flat bed without using bedrails?: A Little Help needed moving from lying on your back to sitting on the side of a flat bed without using bedrails?: A Lot Help needed moving to and from a bed to a chair (including a wheelchair)?: A Little Help needed standing up from a chair using your arms (e.g., wheelchair or bedside chair)?: A Little Help needed to walk in hospital room?: A Little Help needed climbing 3-5 steps with a railing? : A Lot 6 Click Score: 16    End of Session Equipment Utilized During Treatment: Gait belt Activity Tolerance: Patient limited by pain Patient left: in bed;with call bell/phone within reach;with nursing/sitter in room Nurse Communication: Mobility status PT Visit Diagnosis: Unsteadiness on feet (R26.81);Muscle weakness (generalized) (M62.81);Difficulty in walking, not elsewhere classified (R26.2);Pain Pain - Right/Left: Right Pain - part of body: Hip     Time: 8295-6213 PT Time Calculation (min) (ACUTE ONLY): 29 min  Charges:  $Gait Training: 8-22 mins $Therapeutic Exercise: 8-22 mins                     Bonney Leitz , PTA Acute Rehabilitation Services Pager [phone] Office [phone]     Jeremy Dudley Artis Delay 07/24/2019, 2:34 PM

## 2019-07-24 NOTE — Progress Notes (Signed)
Subjective: 1 Day Post-Op Procedure(s) (LRB): RIGHT TOTAL HIP ARTHROPLASTY ANTERIOR APPROACH (Right) Patient reports pain as severe.  No chest pain or SOB.   Objective: Vital signs in last 24 hours: Temp:  [97.5 F (36.4 C)-98.7 F (37.1 C)] 97.8 F (36.6 C) (06/23 0312) Pulse Rate:  [62-78] 65 (06/23 0312) Resp:  [13-22] 17 (06/23 0312) BP: (119-151)/(60-94) 135/62 (06/23 0312) SpO2:  [93 %-100 %] 97 % (06/23 0312) Weight:  [139.7 kg] 139.7 kg (06/22 1024)  Intake/Output from previous day: 06/22 0701 - 06/23 0700 In: 1400 [I.V.:1000; IV Piggyback:400] Out: 350 [Blood:350] Intake/Output this shift: No intake/output data recorded.  Recent Labs    07/24/19 0438  HGB 11.1*   Recent Labs    07/24/19 0438  WBC 11.8*  RBC 3.65*  HCT 34.1*  PLT 251   Recent Labs    07/24/19 0438  NA 139  K 4.0  CL 101  CO2 27  BUN 9  CREATININE 0.98  GLUCOSE 158*  CALCIUM 8.9   No results for input(s): LABPT, INR in the last 72 hours.  Right lower extremity: Sensation intact distally Dorsiflexion/Plantar flexion intact Incision: dressing C/D/I Compartment soft   Assessment/Plan: 1 Day Post-Op Procedure(s) (LRB): RIGHT TOTAL HIP ARTHROPLASTY ANTERIOR APPROACH (Right) Up with therapy      Jeremy Dudley 07/24/2019, 8:38 AM

## 2019-07-25 MED ORDER — METHOCARBAMOL 500 MG PO TABS: 500.0000 mg | ORAL_TABLET | Freq: Four times a day (QID) | ORAL | 1 refills | Status: AC | PRN

## 2019-07-25 MED ORDER — GABAPENTIN 100 MG PO CAPS: 100.0000 mg | ORAL_CAPSULE | Freq: Three times a day (TID) | ORAL | 1 refills | Status: DC

## 2019-07-25 MED ORDER — ASPIRIN 81 MG PO CHEW: 81.0000 mg | CHEWABLE_TABLET | Freq: Two times a day (BID) | ORAL | 0 refills | Status: AC

## 2019-07-25 MED ORDER — OXYCODONE HCL 5 MG PO TABS: 5.0000 mg | ORAL_TABLET | ORAL | 0 refills | Status: DC | PRN

## 2019-07-25 NOTE — Progress Notes (Signed)
Subjective: 2 Days Post-Op Procedure(s) (LRB): RIGHT TOTAL HIP ARTHROPLASTY ANTERIOR APPROACH (Right) Patient reports pain as moderate.  Otherwise states he is sore from ambulating with PT yesterday.   Objective: Vital signs in last 24 hours: Temp:  [98.6 F (37 C)-98.8 F (37.1 C)] 98.8 F (37.1 C) (06/23 1536) Pulse Rate:  [71-73] 71 (06/23 1536) Resp:  [18] 18 (06/23 1536) BP: (118-131)/(51-58) 131/58 (06/23 1536) SpO2:  [98 %-99 %] 99 % (06/23 1536)  Intake/Output from previous day: 06/23 0701 - 06/24 0700 In: 789.1 [I.V.:789.1] Out: 375 [Urine:375] Intake/Output this shift: No intake/output data recorded.  Recent Labs    07/24/19 0438  HGB 11.1*   Recent Labs    07/24/19 0438  WBC 11.8*  RBC 3.65*  HCT 34.1*  PLT 251   Recent Labs    07/24/19 0438  NA 139  K 4.0  CL 101  CO2 27  BUN 9  CREATININE 0.98  GLUCOSE 158*  CALCIUM 8.9   No results for input(s): LABPT, INR in the last 72 hours.  Right lower extremity: Sensation intact distally Dorsiflexion/Plantar flexion intact Incision: scant drainage Compartment soft   Assessment/Plan: 2 Days Post-Op Procedure(s) (LRB): RIGHT TOTAL HIP ARTHROPLASTY ANTERIOR APPROACH (Right) Up with therapy  If pain is under control and patient does well with PT then will discharge to home this afternoon.       Jeremy Dudley 07/25/2019, 11:09 AM

## 2019-07-25 NOTE — Plan of Care (Signed)

## 2019-07-25 NOTE — Discharge Summary (Signed)
Patient ID: Jeremy Dudley MRN: [id number] DOB/AGE: [birth year] 45 y.o.  Admit date: 07/23/2019 Discharge date: 07/25/2019  Admission Diagnoses:  Principal Problem:   Unilateral primary osteoarthritis, right hip Active Problems:   Status post total replacement of right hip   Discharge Diagnoses:  Same  Past Medical History:  Diagnosis Date  . Depression   . Hepatitis    fatty liver  . History of kidney stones   . Hypertension   . Sleep apnea    bipap    Surgeries: Procedure(s): RIGHT TOTAL HIP ARTHROPLASTY ANTERIOR APPROACH on 07/23/2019   Consultants:   Discharged Condition: Improved  Hospital Course: Jeremy Dudley is an 44 y.o. male who was admitted 07/23/2019 for operative treatment ofUnilateral primary osteoarthritis, right hip. Patient has severe unremitting pain that affects sleep, daily activities, and work/hobbies. After pre-op clearance the patient was taken to the operating room on 07/23/2019 and underwent  Procedure(s): RIGHT TOTAL HIP ARTHROPLASTY ANTERIOR APPROACH.    Patient was given perioperative antibiotics:  Anti-infectives (From admission, onward)   Start     Dose/Rate Route Frequency Ordered Stop   07/23/19 1715  ceFAZolin (ANCEF) IVPB 2g/100 mL premix        2 g 200 mL/hr over 30 Minutes Intravenous Every 6 hours 07/23/19 1700 07/23/19 2330   07/23/19 0600  ceFAZolin (ANCEF) 3 g in dextrose 5 % 50 mL IVPB        3 g 100 mL/hr over 30 Minutes Intravenous On call to O.R. 07/22/19 0654 07/23/19 1245       Patient was given sequential compression devices, early ambulation, and chemoprophylaxis to prevent DVT.  Patient benefited maximally from hospital stay and there were no complications.    Recent vital signs:  Patient Vitals for the past 24 hrs:  BP Temp Pulse Resp SpO2  07/24/19 1536 (!) 131/58 98.8 F (37.1 C) 71 18 99 %  07/24/19 1320 (!) 118/51 98.6 F (37 C) 73 18 98 %     Recent laboratory studies:  Recent Labs    07/24/19 0438  WBC  11.8*  HGB 11.1*  HCT 34.1*  PLT 251  NA 139  K 4.0  CL 101  CO2 27  BUN 9  CREATININE 0.98  GLUCOSE 158*  CALCIUM 8.9     Discharge Medications:   Allergies as of 07/25/2019   No Known Allergies     Medication List    STOP taking these medications   aspirin 81 MG EC tablet Replaced by: aspirin 81 MG chewable tablet     TAKE these medications   aspirin 81 MG chewable tablet Chew 1 tablet (81 mg total) by mouth 2 (two) times daily. Replaces: aspirin 81 MG EC tablet   buPROPion 300 MG 24 hr tablet Commonly known as: WELLBUTRIN XL Take 300 mg by mouth daily.   citalopram 40 MG tablet Commonly known as: CELEXA Take 40 mg by mouth daily.   CoQ10 200 MG Caps Take 200 mg by mouth daily.   gabapentin 100 MG capsule Commonly known as: NEURONTIN Take 1 capsule (100 mg total) by mouth 3 (three) times daily.   methocarbamol 500 MG tablet Commonly known as: ROBAXIN Take 1 tablet (500 mg total) by mouth every 6 (six) hours as needed for muscle spasms.   metoprolol tartrate 25 MG tablet Commonly known as: LOPRESSOR Take 25 mg by mouth 2 (two) times daily.   mirabegron ER 50 MG Tb24 tablet Commonly known as: MYRBETRIQ Take 1 tablet (50 mg total) by  mouth daily.   multivitamin with minerals tablet Take 1 tablet by mouth daily.   oxyCODONE 5 MG immediate release tablet Commonly known as: Oxy IR/ROXICODONE Take 1-2 tablets (5-10 mg total) by mouth every 4 (four) hours as needed for moderate pain (pain score 4-6).   simvastatin 40 MG tablet Commonly known as: ZOCOR Take 40 mg by mouth at bedtime.            Durable Medical Equipment  (From admission, onward)         Start     Ordered   07/24/19 1706  For home use only DME Walker rolling  Once       Comments: Heavy duty  Question Answer Comment  Walker: With 5 Inch Wheels   Patient needs a walker to treat with the following condition History of right hip hemiarthroplasty      07/24/19 1706   07/24/19  1705  For home use only DME 3 n 1  Once       Comments: Heavy duty   07/24/19 1706   07/23/19 1701  DME 3 n 1  Once        07/23/19 1700   07/23/19 1701  DME Walker rolling  Once       Question Answer Comment  Walker: With 5 Inch Wheels   Patient needs a walker to treat with the following condition Status post total replacement of right hip      07/23/19 1700          Diagnostic Studies: DG Pelvis Portable  Result Date: 07/23/2019 CLINICAL DATA:  Status post hip replacement EXAM: PORTABLE PELVIS 1-2 VIEWS COMPARISON:  10/17/2018 FINDINGS: SI joints are non widened. Pubic symphysis and rami are intact. Status post right hip replacement with intact hardware and normal alignment. Moderate arthritis of the left hip. Gas in the soft tissues of the right hip consistent with recent surgery IMPRESSION: Status post right hip replacement with expected postsurgical changes. Electronically Signed   By: Donavan Foil M.D.   On: 07/23/2019 16:02   DG C-Arm 1-60 Min  Result Date: 07/23/2019 CLINICAL DATA:  Surgery EXAM: OPERATIVE right HIP (WITH PELVIS IF PERFORMED) 4 VIEWS TECHNIQUE: Fluoroscopic spot image(s) were submitted for interpretation post-operatively. COMPARISON:  10/17/2018 FINDINGS: Four low resolution intraoperative spot views of the right hip. Total fluoroscopy time was 31 seconds. Advanced arthritis of the right hip with subsequent right hip replacement. Anatomic alignment. IMPRESSION: Intraoperative fluoroscopic assistance provided during right hip replacement. Electronically Signed   By: Donavan Foil M.D.   On: 07/23/2019 16:01   DG HIP OPERATIVE UNILAT W OR W/O PELVIS RIGHT  Result Date: 07/23/2019 CLINICAL DATA:  Surgery EXAM: OPERATIVE right HIP (WITH PELVIS IF PERFORMED) 4 VIEWS TECHNIQUE: Fluoroscopic spot image(s) were submitted for interpretation post-operatively. COMPARISON:  10/17/2018 FINDINGS: Four low resolution intraoperative spot views of the right hip. Total fluoroscopy  time was 31 seconds. Advanced arthritis of the right hip with subsequent right hip replacement. Anatomic alignment. IMPRESSION: Intraoperative fluoroscopic assistance provided during right hip replacement. Electronically Signed   By: Donavan Foil M.D.   On: 07/23/2019 16:01    Disposition: Discharge disposition: 01-Home or [place]    Mcarthur Rossetti, MD Follow up in 2 week(s).   Specialty: Orthopedic Surgery Contact information: [place] [address] [phone]        Home, [facility] Follow up.   Specialty: Home Health  Services Why: You will be receiving physical therapy in the next 24-48 hours after discharge home. Contact information: [address] [place] [address] [phone]        [facility] Follow up.   Why: You will be receiving a rolling walker and bedside commode before you are discharged home. Contact information: [address] [place] [address] [phone]                Signed: Richardean Canal 07/25/2019, 11:27 AM

## 2019-07-25 NOTE — Plan of Care (Signed)

## 2019-07-25 NOTE — Progress Notes (Signed)
Physical Therapy Treatment Patient Details Name: Jeremy Dudley MRN: [id number] DOB: 12/15/1975 Today's Date: 07/25/2019    History of Present Illness Pt is a 44 y/o male admitted secondary to R THA, direct anterior. PMH includes sleep apnea on CPAP, HTN, hepatitis and current smoker.     PT Comments    Followed up for pm session to review standing exercises.  Pt increased gt distance.  Pt to d/c home this pm with his wife.    Follow Up Recommendations  Follow surgeon's recommendation for DC plan and follow-up therapies;Supervision for mobility/OOB     Equipment Recommendations  Rolling walker with 5" wheels;3in1 (PT)    Recommendations for Other Services OT consult     Precautions / Restrictions Precautions Precautions: Fall Restrictions Weight Bearing Restrictions: Yes RLE Weight Bearing: Weight bearing as tolerated    Mobility  Bed Mobility Overal bed mobility: Needs Assistance Bed Mobility: Supine to Sit     Supine to sit: Mod assist     General bed mobility comments: Attempted without bed rails and he required mod assistance.  Transfers Overall transfer level: Needs assistance Equipment used: Rolling walker (2 wheeled) (bari) Transfers: Sit to/from Stand Sit to Stand: Supervision         General transfer comment: Cues for hand placement to and from seated surface.  Ambulation/Gait Ambulation/Gait assistance: Supervision Gait Distance (Feet): 120 Feet Assistive device: Rolling walker (2 wheeled) Gait Pattern/deviations: Step-to pattern;Shuffle;Trunk flexed     General Gait Details: Continued heavy reliance on UE for support.  Pt able to increased gt distance this am.  Adjusted RW height for improved fit.   Stairs             Wheelchair Mobility    Modified Rankin (Stroke Patients Only)       Balance Overall balance assessment: Needs assistance Sitting-balance support: No upper extremity supported;Feet supported Sitting balance-Leahy  Scale: Fair     Standing balance support: Bilateral upper extremity supported;During functional activity Standing balance-Leahy Scale: Poor Standing balance comment: external assist in static stand beyond 5 seconds                            Cognition Arousal/Alertness: Awake/alert Behavior During Therapy: WFL for tasks assessed/performed;Anxious Overall Cognitive Status: Within Functional Limits for tasks assessed                                 General Comments: no anxiety this pm.      Exercises Total Joint Exercises Hip ABduction/ADduction: AROM;Right;10 reps;Standing Long Arc Quad: AROM;Right;10 reps;Seated Knee Flexion: AROM;Right;10 reps;Standing Marching in Standing: AROM;Right;10 reps;Standing Standing Hip Extension: AROM;Right;10 reps;Standing    General Comments        Pertinent Vitals/Pain Pain Assessment: 0-10 Pain Score: 4  Faces Pain Scale: Hurts little more Pain Location: R hip/thigh/knee Pain Descriptors / Indicators: Grimacing;Guarding Pain Intervention(s): Monitored during session;Premedicated before session    Home Living                      Prior Function            PT Goals (current goals can now be found in the care plan section) Acute Rehab PT Goals Patient Stated Goal: to decrease pain, improve mobility, regain independence Potential to Achieve Goals: Good Progress towards PT goals: Progressing toward goals    Frequency    7X/week  PT Plan Current plan remains appropriate    Co-evaluation              AM-PAC PT "6 Clicks" Mobility   Outcome Measure  Help needed turning from your back to your side while in a flat bed without using bedrails?: A Little Help needed moving from lying on your back to sitting on the side of a flat bed without using bedrails?: A Little Help needed moving to and from a bed to a chair (including a wheelchair)?: A Little Help needed standing up from a chair  using your arms (e.g., wheelchair or bedside chair)?: A Little Help needed to walk in hospital room?: A Little Help needed climbing 3-5 steps with a railing? : A Little 6 Click Score: 18    End of Session Equipment Utilized During Treatment: Gait belt Activity Tolerance: Patient limited by pain Patient left: in bed;with call bell/phone within reach;with nursing/sitter in room Nurse Communication: Mobility status PT Visit Diagnosis: Unsteadiness on feet (R26.81);Muscle weakness (generalized) (M62.81);Difficulty in walking, not elsewhere classified (R26.2);Pain Pain - Right/Left: Right Pain - part of body: Hip     Time: 0677-0340 PT Time Calculation (min) (ACUTE ONLY): 27 min  Charges:  $Gait Training: 8-22 mins $Therapeutic Exercise: 8-22 mins                     Erasmo Leventhal , PTA Acute Rehabilitation Services Pager [phone] Office [phone]     Jeremy Dudley Jeremy Dudley 07/25/2019, 3:21 PM

## 2019-07-25 NOTE — Progress Notes (Signed)
Pt IV removed, belongings gathered and pt dressed. DME is in the room and pt call ride. AVS reviewed and all questions answered to pt satisfaction.

## 2019-07-25 NOTE — Progress Notes (Addendum)
Physical Therapy Treatment Patient Details Name: Abdalrahman Clementson MRN: [id number] DOB: [birth year] Today's Date: 07/25/2019    History of Present Illness Pt is a 44 y/o male admitted secondary to R THA, direct anterior. PMH includes sleep apnea on CPAP, HTN, hepatitis and current smoker.     PT Comments    Pt supine in bed on arrival.  Pt able to move to edge of bed with heavy use of bed rails but denies need for hospital bed at d/c.  Will f/u in pm to review standing HEP.   Follow Up Recommendations  Follow surgeon's recommendation for DC plan and follow-up therapies;Supervision for mobility/OOB     Equipment Recommendations  Rolling walker with 5" wheels;3in1 (PT)    Recommendations for Other Services       Precautions / Restrictions Precautions Precautions: Fall Restrictions Weight Bearing Restrictions: Yes RLE Weight Bearing: Weight bearing as tolerated    Mobility  Bed Mobility Overal bed mobility: Needs Assistance Bed Mobility: Supine to Sit;Sit to Supine     Supine to sit: Min guard     General bed mobility comments: Increased time and effort with heavy use of bed rails.  He will likley need moderate assistance without use of bed rails.  Transfers Overall transfer level: Needs assistance Equipment used: Rolling walker (2 wheeled) (bariatric RW) Transfers: Sit to/from Stand Sit to Stand: Min guard         General transfer comment: Cues for hand placement to and from seated surface.  Ambulation/Gait Ambulation/Gait assistance: Min guard Gait Distance (Feet): 80 Feet Assistive device: Rolling walker (2 wheeled) Gait Pattern/deviations: Step-to pattern;Shuffle;Trunk flexed     General Gait Details: Continued heavy reliance on UE for support.  Pt able to increased gt distance this am.   Stairs             Wheelchair Mobility    Modified Rankin (Stroke Patients Only)       Balance Overall balance assessment: Needs assistance Sitting-balance  support: No upper extremity supported;Feet supported Sitting balance-Leahy Scale: Fair     Standing balance support: Bilateral upper extremity supported;During functional activity Standing balance-Leahy Scale: Poor Standing balance comment: external assist in static stand beyond 5 seconds                            Cognition Arousal/Alertness: Awake/alert Behavior During Therapy: WFL for tasks assessed/performed;Anxious Overall Cognitive Status: Within Functional Limits for tasks assessed                                 General Comments: Less anxious then this day      Exercises Total Joint Exercises Ankle Circles/Pumps: AROM;Both;20 reps;Supine Quad Sets: AROM;Right;10 reps;Supine Heel Slides: AAROM;Right;10 reps;Supine Hip ABduction/ADduction: AAROM;Right;10 reps;Supine    General Comments        Pertinent Vitals/Pain Pain Assessment: 0-10 Faces Pain Scale: Hurts little more Pain Location: R hip/thigh/knee Pain Descriptors / Indicators: Aching;Grimacing;Guarding Pain Intervention(s): Monitored during session;Repositioned    Home Living                      Prior Function            PT Goals (current goals can now be found in the care plan section) Acute Rehab PT Goals Patient Stated Goal: to decrease pain, improve mobility, regain independence Potential to Achieve Goals: Good Progress towards PT goals:  Progressing toward goals    Frequency    7X/week      PT Plan Current plan remains appropriate    Co-evaluation              AM-PAC PT "6 Clicks" Mobility   Outcome Measure  Help needed turning from your back to your side while in a flat bed without using bedrails?: A Little Help needed moving from lying on your back to sitting on the side of a flat bed without using bedrails?: A Little Help needed moving to and from a bed to a chair (including a wheelchair)?: A Little Help needed standing up from a chair using  your arms (e.g., wheelchair or bedside chair)?: A Little Help needed to walk in hospital room?: A Little Help needed climbing 3-5 steps with a railing? : A Little 6 Click Score: 18    End of Session Equipment Utilized During Treatment: Gait belt Activity Tolerance: Patient limited by pain Patient left: in bed;with call bell/phone within reach;with nursing/sitter in room Nurse Communication: Mobility status PT Visit Diagnosis: Unsteadiness on feet (R26.81);Muscle weakness (generalized) (M62.81);Difficulty in walking, not elsewhere classified (R26.2);Pain Pain - Right/Left: Right Pain - part of body: Hip     Time: 1201-1229 PT Time Calculation (min) (ACUTE ONLY): 28 min  Charges:  $Gait Training: 8-22 mins $Therapeutic Exercise: 8-22 mins                     Erasmo Leventhal , PTA Acute Rehabilitation Services Pager [phone] Office [phone]     Filemon Breton [name] 07/25/2019, 3:15 PM

## 2019-07-29 ENCOUNTER — Telehealth: Payer: Self-pay

## 2019-07-29 ENCOUNTER — Telehealth: Payer: Self-pay | Admitting: Orthopaedic Surgery

## 2019-07-29 MED ORDER — IBUPROFEN 800 MG PO TABS
800.0000 mg | ORAL_TABLET | Freq: Three times a day (TID) | ORAL | 1 refills | Status: AC | PRN
Start: 2019-07-29 — End: ?

## 2019-07-29 MED ORDER — GABAPENTIN 100 MG PO CAPS: 300.0000 mg | ORAL_CAPSULE | Freq: Three times a day (TID) | ORAL | 1 refills | Status: AC

## 2019-07-29 MED ORDER — OXYCODONE HCL 5 MG PO TABS: 5.0000 mg | ORAL_TABLET | ORAL | 0 refills | Status: DC | PRN

## 2019-07-29 NOTE — Telephone Encounter (Signed)
Patient called in for refill of oxycodone.

## 2019-07-29 NOTE — Telephone Encounter (Signed)
I cannot prescribe any more in terms of the quantity of narcotics per [place] prescribing laws for narcotics.  I cannot go up on the dosage either.  I did send in some more of the oxycodone but also will increase his Neurontin to 300 mg 3 times a day and send in 800 mg ibuprofen to take 3 times a day.

## 2019-07-29 NOTE — Telephone Encounter (Signed)
LMOM for patient of the below message  

## 2019-07-29 NOTE — Telephone Encounter (Signed)
Patient's wife Jeremy Dudley call requesting stronger or more quantity of oxycodone. Jeremy Dudley states husband had surgery and is in severe pains. Please send to pharmacy on file. Patient' wife phone number is [phone] or patient number is [phone].

## 2019-07-29 NOTE — Telephone Encounter (Signed)
Please advise 

## 2019-08-07 ENCOUNTER — Other Ambulatory Visit: Payer: Self-pay

## 2019-08-07 ENCOUNTER — Ambulatory Visit (INDEPENDENT_AMBULATORY_CARE_PROVIDER_SITE_OTHER): Payer: Medicare Other | Admitting: Orthopaedic Surgery

## 2019-08-07 ENCOUNTER — Telehealth: Payer: Self-pay | Admitting: Urology

## 2019-08-07 ENCOUNTER — Encounter: Payer: Self-pay | Admitting: Orthopaedic Surgery

## 2019-08-07 DIAGNOSIS — Z96641 Presence of right artificial hip joint: Secondary | ICD-10-CM

## 2019-08-07 MED ORDER — OXYCODONE HCL 5 MG PO TABS: 5.0000 mg | ORAL_TABLET | ORAL | 0 refills | Status: AC | PRN

## 2019-08-07 NOTE — Progress Notes (Signed)
The patient comes in today for postoperative visit status post a right total hip arthroplasty.  He is someone who is morbidly obese but his hip was melting away to the point that we felt a hip replacement was treated for him.  He is walking with arm crutches for long period time with debilitating right hip pain.  He did tolerate the surgery well.  On examination of his left hip incision, so far it looks okay.  I remove the staples in place Steri-Strips.  I did drain about 90 cc of the seroma off of the hip area.  He tolerated this well.  He is going watch his incision closely.  He can go back to just a daily aspirin.  I did send in some oxycodone for him.  We will see him back in 4 weeks unless he is having any issues before then.  I am concerned about his obesity and how this can affect his incision but so far everything looks okay.

## 2019-08-07 NOTE — Telephone Encounter (Signed)
Left message to return call 

## 2019-08-07 NOTE — Telephone Encounter (Signed)
Pt wife called and stated the 50 mg mybetriq is no longer working. Would like to know what else they could do. Would like a phone call back.

## 2019-08-08 NOTE — Telephone Encounter (Signed)
Wife reports myrbetriq 50mg  no longer working. Pt  has appt on 7/21 to see you. Pt will keep scheduled appt.

## 2019-08-21 ENCOUNTER — Other Ambulatory Visit: Payer: Self-pay | Admitting: Physician Assistant

## 2019-08-21 ENCOUNTER — Other Ambulatory Visit: Payer: Self-pay

## 2019-08-21 ENCOUNTER — Encounter: Payer: Self-pay | Admitting: Urology

## 2019-08-21 ENCOUNTER — Ambulatory Visit (INDEPENDENT_AMBULATORY_CARE_PROVIDER_SITE_OTHER): Payer: Medicare Other | Admitting: Urology

## 2019-08-21 ENCOUNTER — Telehealth: Payer: Self-pay | Admitting: Orthopaedic Surgery

## 2019-08-21 VITALS — BP 113/65 | HR 69 | Temp 97.3°F | Ht 64.0 in | Wt 322.0 lb

## 2019-08-21 DIAGNOSIS — N3941 Urge incontinence: Secondary | ICD-10-CM | POA: Diagnosis not present

## 2019-08-21 MED ORDER — SOLIFENACIN SUCCINATE 5 MG PO TABS: 5.0000 mg | ORAL_TABLET | Freq: Every day | ORAL | 3 refills | Status: AC

## 2019-08-21 MED ORDER — HYDROCODONE-ACETAMINOPHEN 5-325 MG PO TABS: 1.0000 | ORAL_TABLET | Freq: Four times a day (QID) | ORAL | 0 refills | Status: DC | PRN

## 2019-08-21 NOTE — Progress Notes (Signed)
Urological Symptom Review  Patient is experiencing the following symptoms: Hard to postpone urination Get up at night to urinate Leakage of urine Kidney stones  Review of Systems  Gastrointestinal (upper)  : Negative for upper GI symptoms  Gastrointestinal (lower) : Negative for lower GI symptoms  Constitutional : Negative for symptoms  Skin: Negative for skin symptoms  Eyes: Negative for eye symptoms  Ear/Nose/Throat : Negative for Ear/Nose/Throat symptoms  Hematologic/Lymphatic: Negative for Hematologic/Lymphatic symptoms  Cardiovascular : Negative for cardiovascular symptoms  Respiratory : Negative for respiratory symptoms  Endocrine: Negative for endocrine symptoms  Musculoskeletal: Joint pain  Neurological: Negative for neurological symptoms  Psychologic: Negative for psychiatric symptoms

## 2019-08-21 NOTE — Patient Instructions (Signed)

## 2019-08-21 NOTE — Telephone Encounter (Signed)
I called patient and advised. 

## 2019-08-21 NOTE — Telephone Encounter (Signed)
Sent in norco

## 2019-08-21 NOTE — Progress Notes (Signed)
08/21/2019 10:10 AM   Jeremy Dudley 09/15/1975 [id number]  Referring provider: Alvina Filbert, MD [address] [place],  [place] [address]  Urge incontinence  HPI: Jeremy Dudley is a 44yo here for followup for urge incontinence. Last visit he was started on mirabegron 50mg  and initially noted improved in his urgency and resolution of the urge incontinence. Starting 8 weeks ago he noted the mirabegron losing effectiveness and urgency has worsened. He had a hip replacement which makes mobility difficult. Nocturia 1x.    PMH: Past Medical History:  Diagnosis Date  . Depression   . Hepatitis    fatty liver  . History of kidney stones   . Hypertension   . Sleep apnea    bipap    Surgical History: Past Surgical History:  Procedure Laterality Date  . CARDIAC CATHETERIZATION  2001 ?    in 2002  . HERNIA REPAIR    . TOTAL HIP ARTHROPLASTY Right 07/23/2019  . TOTAL HIP ARTHROPLASTY Right 07/23/2019   Procedure: RIGHT TOTAL HIP ARTHROPLASTY ANTERIOR APPROACH;  Surgeon: 07/25/2019, MD;  Location: [facility] OR;  Service: Orthopedics;  Laterality: Right;    Home Medications:  Allergies as of 08/21/2019   No Known Allergies     Medication List       Accurate as of August 21, 2019 10:10 AM. If you have any questions, ask your nurse or doctor.        aspirin 81 MG chewable tablet Chew 1 tablet (81 mg total) by mouth 2 (two) times daily.   buPROPion 300 MG 24 hr tablet Commonly known as: WELLBUTRIN XL Take 300 mg by mouth daily.   citalopram 40 MG tablet Commonly known as: CELEXA Take 40 mg by mouth daily.   CoQ10 200 MG Caps Take 200 mg by mouth daily.   gabapentin 100 MG capsule Commonly known as: NEURONTIN Take 3 capsules (300 mg total) by mouth 3 (three) times daily.   hydrochlorothiazide 12.5 MG capsule Commonly known as: MICROZIDE Take 12.5 mg by mouth daily.   ibuprofen 800 MG tablet Commonly known as: ADVIL Take 1 tablet (800 mg total)  by mouth every 8 (eight) hours as needed.   methocarbamol 500 MG tablet Commonly known as: ROBAXIN Take 1 tablet (500 mg total) by mouth every 6 (six) hours as needed for muscle spasms.   metoprolol tartrate 25 MG tablet Commonly known as: LOPRESSOR Take 25 mg by mouth 2 (two) times daily.   mirabegron ER 50 MG Tb24 tablet Commonly known as: MYRBETRIQ Take 1 tablet (50 mg total) by mouth daily.   multivitamin with minerals tablet Take 1 tablet by mouth daily.   oxyCODONE 5 MG immediate release tablet Commonly known as: Oxy IR/ROXICODONE Take 1-2 tablets (5-10 mg total) by mouth every 4 (four) hours as needed for moderate pain (pain score 4-6).   simvastatin 40 MG tablet Commonly known as: ZOCOR Take 40 mg by mouth at bedtime.       Allergies: No Known Allergies  Family History: History reviewed. No pertinent family history.  Social History:  reports that he has been smoking cigars. He has never used smokeless tobacco. He reports current alcohol use. He reports current drug use. Drug: Marijuana.  ROS: All other review of systems were reviewed and are negative except what is noted above in HPI  Physical Exam: BP 113/65   Pulse 69   Temp (!) 97.3 F (36.3 C)   Ht 5\' 4"  (1.626 m)  Wt (!) 322 lb (146.1 kg)   BMI 55.27 kg/m   Constitutional:  Alert and oriented, No acute distress. HEENT: [place] AT, moist mucus membranes.  Trachea midline, no masses. Cardiovascular: No clubbing, cyanosis, or edema. Respiratory: Normal respiratory effort, no increased work of breathing. GI: Abdomen is soft, nontender, nondistended, no abdominal masses GU: No CVA tenderness.  Lymph: No cervical or inguinal lymphadenopathy. Skin: No rashes, bruises or suspicious lesions. Neurologic: Grossly intact, no focal deficits, moving all 4 extremities. Psychiatric: Normal mood and affect.  Laboratory Data: Lab Results  Component Value Date   WBC 11.8 (H) 07/24/2019   HGB 11.1 (L) 07/24/2019    HCT 34.1 (L) 07/24/2019   MCV 93.4 07/24/2019   PLT 251 07/24/2019    Lab Results  Component Value Date   CREATININE 0.98 07/24/2019    No results found for: PSA  No results found for: TESTOSTERONE  No results found for: HGBA1C  Urinalysis No results found for: COLORURINE, APPEARANCEUR, LABSPEC, PHURINE, GLUCOSEU, HGBUR, BILIRUBINUR, KETONESUR, PROTEINUR, UROBILINOGEN, NITRITE, LEUKOCYTESUR  No results found for: LABMICR, WBCUA, RBCUA, LABEPIT, MUCUS, BACTERIA  Pertinent Imaging:  No results found for this or any previous visit.  No results found for this or any previous visit.  No results found for this or any previous visit.  No results found for this or any previous visit.  No results found for this or any previous visit.  No results found for this or any previous visit.  No results found for this or any previous visit.  No results found for this or any previous visit.   Assessment & Plan:    1. Urge incontinence -We discussed continuing mirabegron 50mg  versus switch to an anticholinergic. We will add vesicare 5mg  and as the patient gains better mobility we will transition to only mirabegron.    No follow-ups on file.  , MD  [facility] [place]

## 2019-08-21 NOTE — Telephone Encounter (Signed)
Please advise 

## 2019-08-21 NOTE — Telephone Encounter (Signed)
Patient's wife Bjorn Loser called advised patient need a Rx for pain. Patient said he do not need anything as strong as Oxycodone. The number to contact patient is [phone]

## 2019-09-04 ENCOUNTER — Encounter: Payer: Self-pay | Admitting: Orthopaedic Surgery

## 2019-09-04 ENCOUNTER — Ambulatory Visit (INDEPENDENT_AMBULATORY_CARE_PROVIDER_SITE_OTHER): Payer: Medicare Other | Admitting: Orthopaedic Surgery

## 2019-09-04 DIAGNOSIS — Z96641 Presence of right artificial hip joint: Secondary | ICD-10-CM

## 2019-09-04 NOTE — Progress Notes (Signed)
The patient is now 6-week status post a right total hip arthroplasty. He reports he is doing well overall. He is someone who weighs 322 pounds but he is under 5 feet tall. Both his hips are basically melting away. He is very satisfied with his right hip thus far. He does report he is ambulating better. He used to use two walking sticks now he uses just one cane.  On examination his right operative hip incision looks good. There is a small opening at the very top that is superficial. This can be treated with Bactroban ointment daily.  I taught him how to clean his head daily and place this ointment. This should heal up fine. There is no other evidence of wound breakdown. His leg lengths look good. His right operative hip is moving well. 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 With that being said I will see him back in 3 months and at that visit would like an AP and lateral of the right hip.

## 2019-09-11 ENCOUNTER — Telehealth: Payer: Self-pay | Admitting: Orthopaedic Surgery

## 2019-09-11 MED ORDER — HYDROCODONE-ACETAMINOPHEN 5-325 MG PO TABS: 1.0000 | ORAL_TABLET | Freq: Four times a day (QID) | ORAL | 0 refills | Status: AC | PRN

## 2019-09-11 NOTE — Telephone Encounter (Signed)
Please advise Request refill

## 2019-09-11 NOTE — Telephone Encounter (Signed)
Patient's wife Jeremy Dudley called requesting a call to [facility] in [place] to verify refill of hydrocodone. Please call pharmacy at [phone] and call Patient at [phone] when medication has been sent in.

## 2019-12-04 ENCOUNTER — Ambulatory Visit: Payer: Self-pay

## 2019-12-04 ENCOUNTER — Encounter: Payer: Self-pay | Admitting: Orthopaedic Surgery

## 2019-12-04 ENCOUNTER — Ambulatory Visit (INDEPENDENT_AMBULATORY_CARE_PROVIDER_SITE_OTHER): Payer: Medicare Other | Admitting: Orthopaedic Surgery

## 2019-12-04 DIAGNOSIS — Z96641 Presence of right artificial hip joint: Secondary | ICD-10-CM | POA: Diagnosis not present

## 2019-12-04 DIAGNOSIS — M1612 Unilateral primary osteoarthritis, left hip: Secondary | ICD-10-CM

## 2019-12-04 NOTE — Progress Notes (Signed)
The patient is getting close to 4 and half months status post a right total hip arthroplasty.  This was due to severe degenerative joint disease.  He is only 44 years old but had a very high BMI.  Is a very short individual.  He reports that hip is done wonderful for him.  He ambulates better and has lost some weight.  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  I can put his right hip through internal and external rotation much better than his preoperative state.  He has essentially almost no pain in that hip now.  His left hip has significant stiffness with attempts of rotation.  An AP pelvis and lateral of the right hip shows a well-seated implant thus far with no complicating features.  The left hip does show severe arthritis.  At this point he will continue to work on hip strengthening exercises and weight loss.  From my standpoint, I do not need to see him back for 6 months with a AP pelvis and lateral of his right hip.  I would like this to be done supine with him pulling up on his abdomen so we can really get a good view of the hips.

## 2020-02-26 ENCOUNTER — Ambulatory Visit: Payer: Medicare Other | Admitting: Urology

## 2020-03-11 ENCOUNTER — Telehealth: Payer: Self-pay

## 2020-03-11 ENCOUNTER — Other Ambulatory Visit: Payer: Self-pay

## 2020-03-11 DIAGNOSIS — N3941 Urge incontinence: Secondary | ICD-10-CM

## 2020-03-11 MED ORDER — MIRABEGRON ER 50 MG PO TB24: 50.0000 mg | ORAL_TABLET | Freq: Every day | ORAL | 11 refills | Status: AC

## 2020-03-11 NOTE — Telephone Encounter (Signed)
Lady called for pt saying his Myrbetriq has run out. Prescription refilled.

## 2020-03-30 ENCOUNTER — Ambulatory Visit: Payer: Medicare Other | Admitting: Urology

## 2020-03-30 DIAGNOSIS — N3941 Urge incontinence: Secondary | ICD-10-CM

## 2020-06-02 ENCOUNTER — Ambulatory Visit: Payer: Medicare Other | Admitting: Orthopaedic Surgery

## 2020-06-17 ENCOUNTER — Ambulatory Visit (INDEPENDENT_AMBULATORY_CARE_PROVIDER_SITE_OTHER): Payer: Medicare Other

## 2020-06-17 ENCOUNTER — Encounter: Payer: Self-pay | Admitting: Orthopaedic Surgery

## 2020-06-17 ENCOUNTER — Ambulatory Visit (INDEPENDENT_AMBULATORY_CARE_PROVIDER_SITE_OTHER): Payer: Medicare Other | Admitting: Orthopaedic Surgery

## 2020-06-17 VITALS — Wt 343.0 lb

## 2020-06-17 DIAGNOSIS — Z96641 Presence of right artificial hip joint: Secondary | ICD-10-CM

## 2020-06-17 DIAGNOSIS — M1612 Unilateral primary osteoarthritis, left hip: Secondary | ICD-10-CM | POA: Diagnosis not present

## 2020-06-17 NOTE — Progress Notes (Signed)
Office Visit Note   Patient: Jeremy Dudley           Date of Birth: 1975/06/18           MRN: [id number] Visit Date: 06/17/2020              Requested by: Alvina Filbert, MD [address] [place],  [place] [address] PCP: Alvina Filbert, MD   Assessment & Plan: Visit Diagnoses:  1. Status post total replacement of right hip   2. Unilateral primary osteoarthritis, left hip     Plan: I have recommended continue weight loss for him.  I cannot comfortably schedule him for a left total hip arthroplasty until he loses more weight.  I would like him to at least lose 50 pounds.  He understands that.  I would like to see him back in 3 months for repeat weight and height assessment with BMI calculation.  No x-rays will be needed.  All questions and concerns were answered and addressed.  Follow-Up Instructions: Return in about 3 months (around 09/17/2020).   Orders:  Orders Placed This Encounter  Procedures  . XR HIP UNILAT W OR W/O PELVIS 1V RIGHT   No orders of the defined types were placed in this encounter.     Procedures: No procedures performed   Clinical Data: No additional findings.   Subjective: Chief Complaint  Patient presents with  . Right Hip - Follow-up  The patient is well-known to me.  He is 11 months status post a right total hip arthroplasty.  We did that hip replacement in light of him having a BMI of just below 55 because he is very short individual and his hips were so bad that we felt that he would continue on weight loss journey with a hip replacement.  His right operative hip was done very well.  He is pain-free on the right side.  His left hip is stiff and very painful.  Unfortunately he has gained weight with a BMI of close to 59.  He says his clothes are fitting looser into the knee clinically he looks smaller but certainly it does show weight gain when he is on our scale compared to all his previous weights.  He had no other acute change in medical  status.  He denies any issues with his right operative hip at all.  He still ambulates using a cane as well. HPI  Review of Systems There is currently listed no headache, chest pain, shortness of breath, fever, chills, nausea, vomiting  Objective: Vital Signs: Wt (!) 343 lb (155.6 kg)   BMI 58.88 kg/m   Physical Exam He is alert and orient x3 and in no acute distress Ortho Exam Examination of his left hip shows it is very stiff with pain with internal and external rotation and I cannot really rotate it much at all.  His right operative hip moves smoothly and fluidly. Specialty Comments:  No specialty comments available.  Imaging: XR HIP UNILAT W OR W/O PELVIS 1V RIGHT  Result Date: 06/17/2020 An AP pelvis and lateral of the right hip shows a well-seated total hip arthroplasty with no complicating features.  The left hip shows severe end-stage arthritis with complete loss of joint space as well as para-articular osteophytes.    PMFS History: Patient Active Problem List   Diagnosis Date Noted  . Urge incontinence 08/21/2019  . Status post total replacement of right hip 07/23/2019  . Unilateral primary osteoarthritis, left hip 10/17/2018  .  Unilateral primary osteoarthritis, right hip 10/17/2018   Past Medical History:  Diagnosis Date  . Depression   . Hepatitis    fatty liver  . History of kidney stones   . Hypertension   . Sleep apnea    bipap    History reviewed. No pertinent family history.  Past Surgical History:  Procedure Laterality Date  . CARDIAC CATHETERIZATION  2001 ?    in [place]  . HERNIA REPAIR    . TOTAL HIP ARTHROPLASTY Right 07/23/2019  . TOTAL HIP ARTHROPLASTY Right 07/23/2019   Procedure: RIGHT TOTAL HIP ARTHROPLASTY ANTERIOR APPROACH;  Surgeon: Kathryne Hitch, MD;  Location: [facility] OR;  Service: Orthopedics;  Laterality: Right;   Social History   Occupational History  . Not on file  Tobacco Use  . Smoking status: Light Tobacco  Smoker    Types: Cigars  . Smokeless tobacco: Never Used  Vaping Use  . Vaping Use: Never used  Substance and Sexual Activity  . Alcohol use: Yes    Comment: socially  . Drug use: Yes    Types: Marijuana  . Sexual activity: Not on file

## 2020-09-21 ENCOUNTER — Ambulatory Visit: Payer: Medicare Other | Admitting: Orthopaedic Surgery

## 2020-10-07 ENCOUNTER — Telehealth: Payer: Self-pay | Admitting: Orthopaedic Surgery

## 2020-10-07 ENCOUNTER — Other Ambulatory Visit: Payer: Self-pay | Admitting: Orthopaedic Surgery

## 2020-10-07 ENCOUNTER — Ambulatory Visit: Payer: Medicare Other | Admitting: Orthopaedic Surgery

## 2020-10-07 MED ORDER — TRAMADOL HCL 50 MG PO TABS: 100.0000 mg | ORAL_TABLET | Freq: Four times a day (QID) | ORAL | 0 refills | Status: AC | PRN

## 2020-10-07 NOTE — Telephone Encounter (Signed)
Patient aware this was called in and she states she doesn't want [place] to refer him yet, she's going to try to be stricter on his diet  If she changes her mind they will call me back

## 2020-10-07 NOTE — Telephone Encounter (Signed)
Pt called and states that pt weighed 345 in his underwear and they have an hour drive so do they need to just rescheduled for a later late?   CB [phone]

## 2020-10-07 NOTE — Telephone Encounter (Signed)
Yes ma'am, I did! They have some questions about what he should do to lose weight? Jeremy Dudley just wanting to know what they could do in the meantime to be prepared for the next appt.

## 2020-12-16 ENCOUNTER — Ambulatory Visit: Payer: Self-pay | Admitting: Orthopaedic Surgery

## 2021-01-10 IMAGING — DX DG PORTABLE PELVIS
1 series · 1 of 1 positions shown · non-contrast
Comparison: 10/17/2018

CLINICAL DATA: Status post hip replacement

EXAM:
PORTABLE PELVIS 1-2 VIEWS

[pelvis]
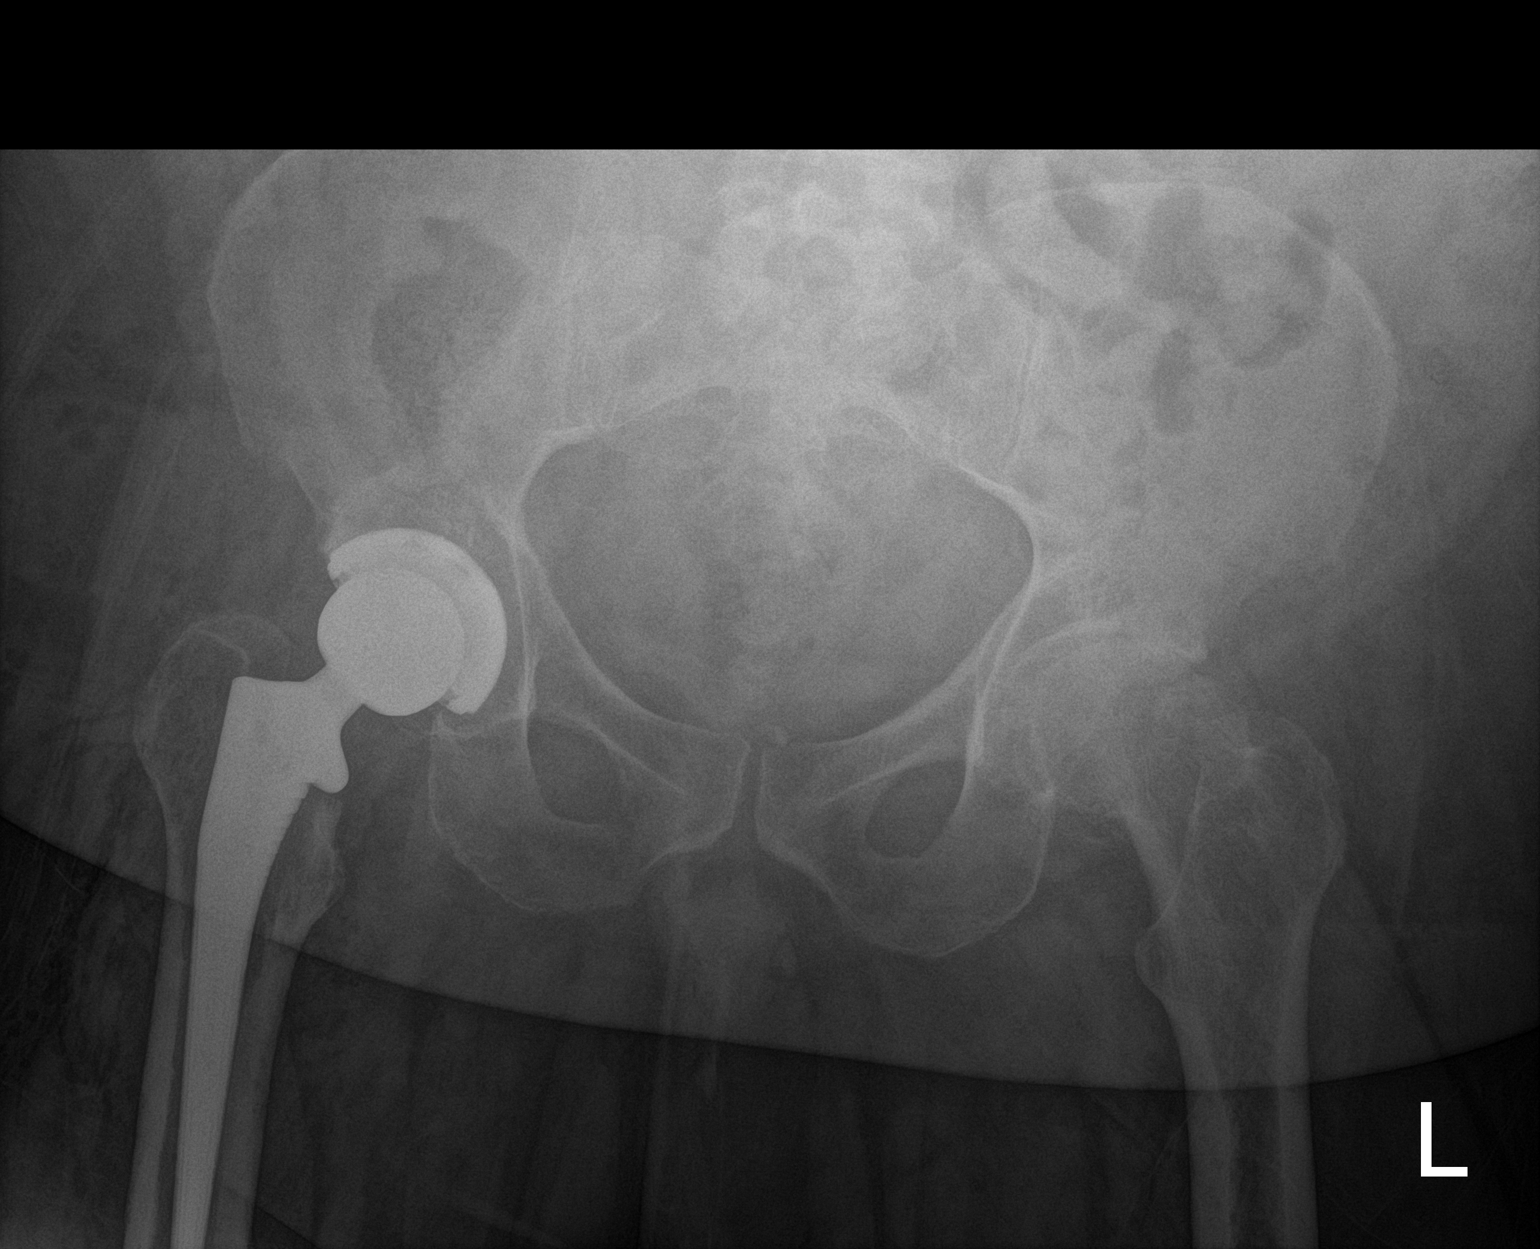

[1 of 1 positions shown; findings below may reference images not displayed]

FINDINGS: SI joints are non widened. Pubic symphysis and rami are intact.
Status post right hip replacement with intact hardware and normal
alignment. Moderate arthritis of the left hip. Gas in the soft
tissues of the right hip consistent with recent surgery
IMPRESSION: Status post right hip replacement with expected postsurgical
changes.

## 2021-01-10 IMAGING — RF DG HIP (WITH PELVIS) OPERATIVE*R*
1 series · 4 of 4 positions shown · non-contrast
Comparison: 10/17/2018

CLINICAL DATA: Surgery

EXAM:
OPERATIVE right HIP (WITH PELVIS IF PERFORMED) 4 VIEWS
TECHNIQUE: Fluoroscopic spot image(s) were submitted for interpretation
post-operatively.

[Series 1: unknown protocol · 0.20mm/px · 4 of 4 slices shown]
[im 1/4]
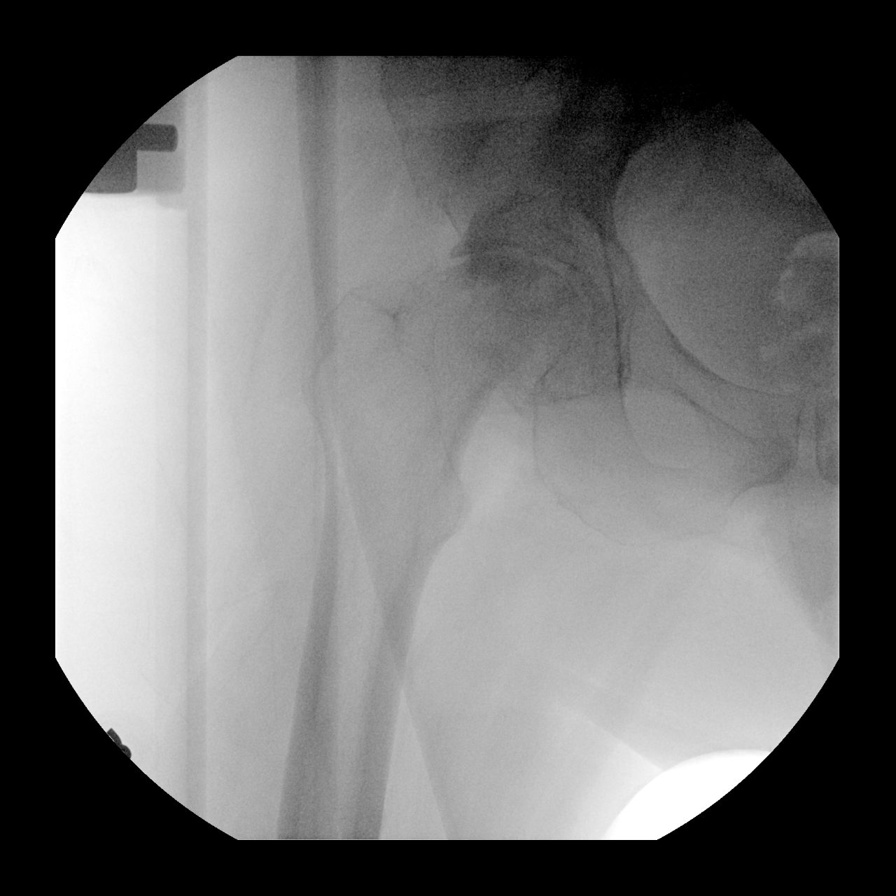
[im 2/4]
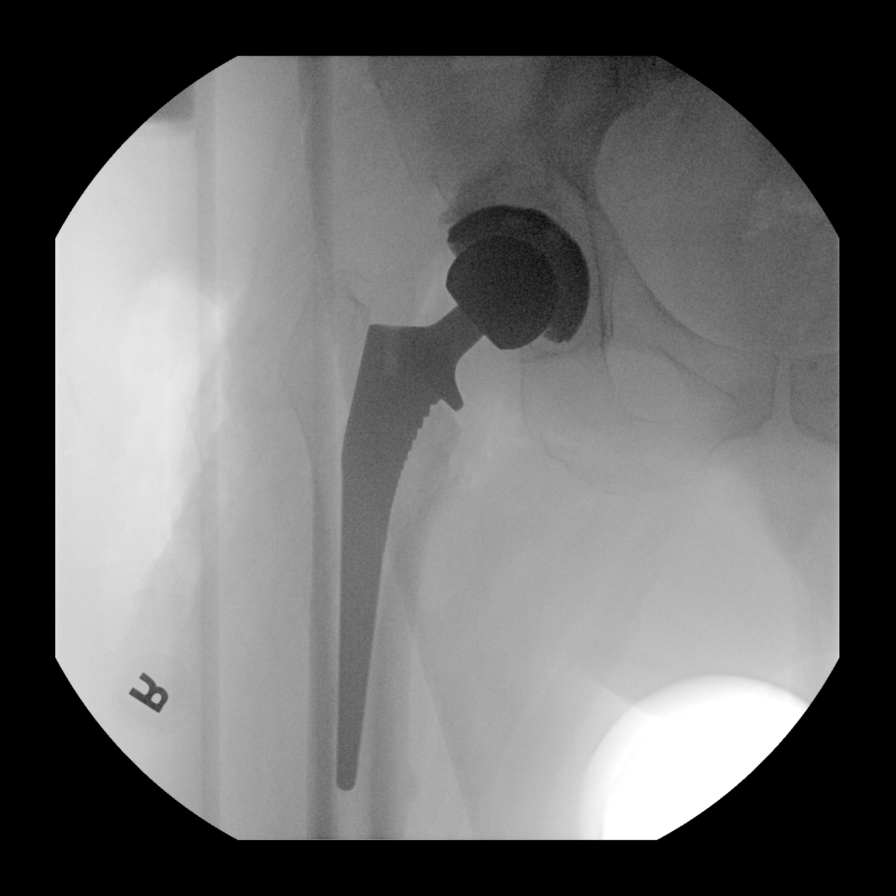
[im 3/4]
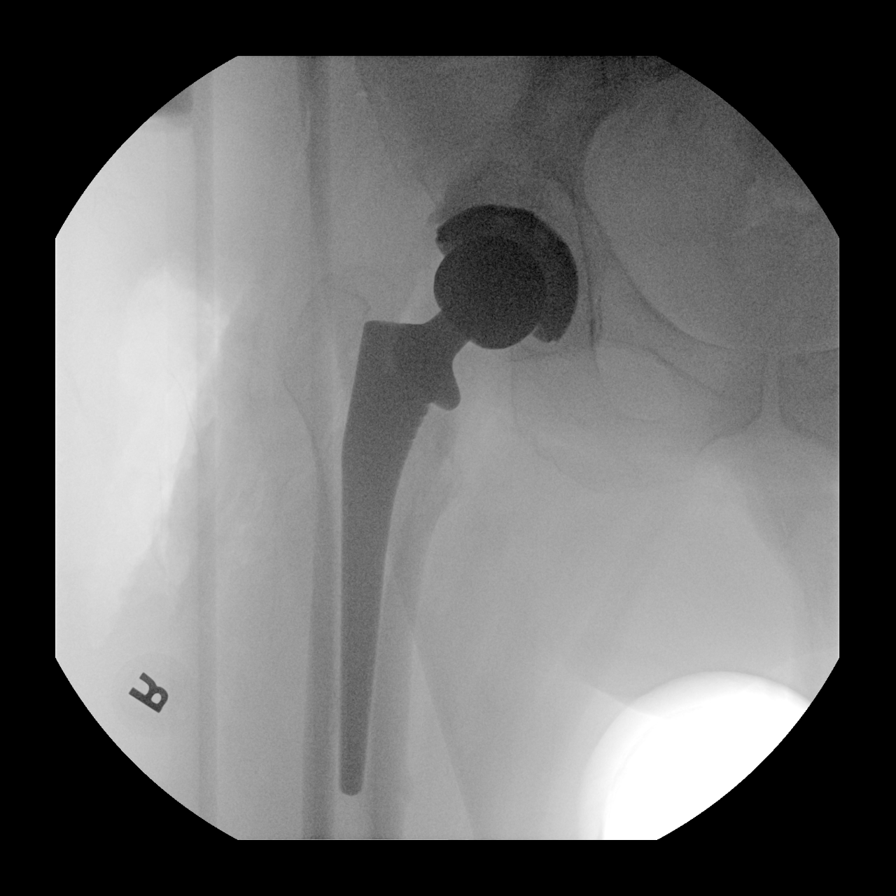
[im 4/4]
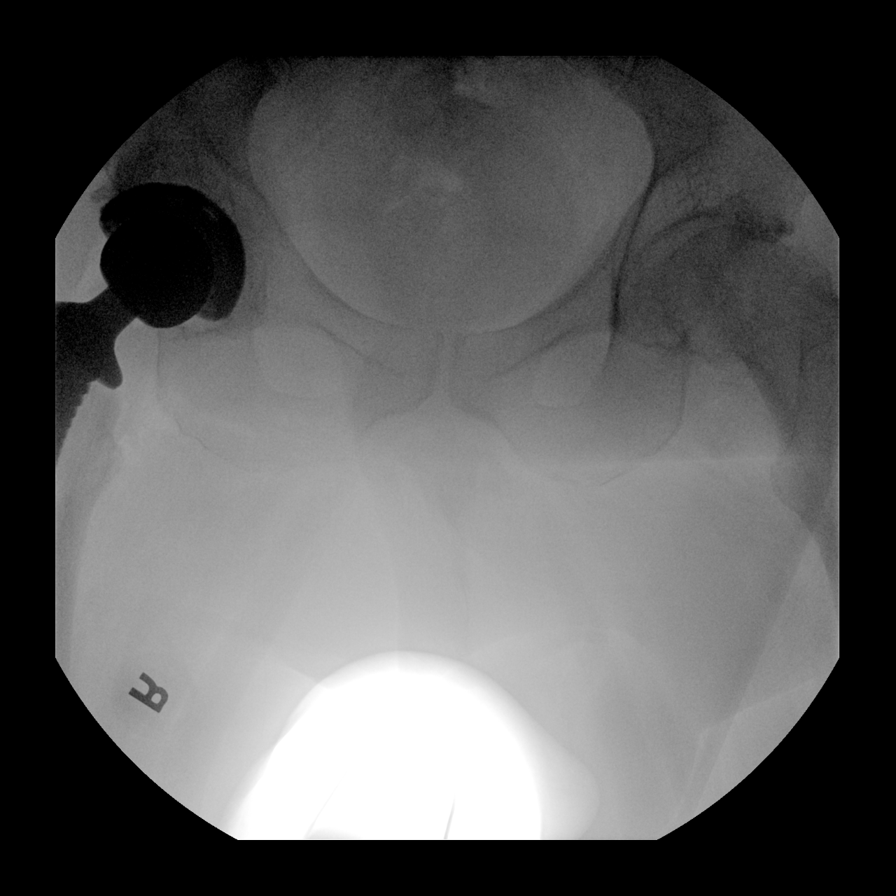

[4 of 4 positions shown; findings below may reference images not displayed]

FINDINGS: Four low resolution intraoperative spot views of the right hip.
Total fluoroscopy time was 31 seconds. Advanced arthritis of the
right hip with subsequent right hip replacement. Anatomic alignment.
IMPRESSION: Intraoperative fluoroscopic assistance provided during right hip
replacement.
